# Patient Record
Sex: Female | Born: 1945 | Race: White | Hispanic: No | Marital: Married | Smoking: Never smoker
Health system: Southern US, Community
[De-identification: ages and names within clinical notes are randomized; demographics above are authoritative.]

## PROBLEM LIST (undated history)

## (undated) DIAGNOSIS — E119 Type 2 diabetes mellitus without complications: Secondary | ICD-10-CM

## (undated) DIAGNOSIS — G629 Polyneuropathy, unspecified: Secondary | ICD-10-CM

## (undated) DIAGNOSIS — I10 Essential (primary) hypertension: Secondary | ICD-10-CM

## (undated) DIAGNOSIS — M109 Gout, unspecified: Secondary | ICD-10-CM

## (undated) HISTORY — PX: ABDOMINAL HYSTERECTOMY: SHX81

---

## 2011-09-15 DIAGNOSIS — I10 Essential (primary) hypertension: Secondary | ICD-10-CM | POA: Insufficient documentation

## 2011-09-15 DIAGNOSIS — E669 Obesity, unspecified: Secondary | ICD-10-CM | POA: Insufficient documentation

## 2011-09-15 DIAGNOSIS — G629 Polyneuropathy, unspecified: Secondary | ICD-10-CM | POA: Insufficient documentation

## 2012-03-05 ENCOUNTER — Other Ambulatory Visit: Payer: Self-pay | Admitting: Obstetrics and Gynecology

## 2012-03-05 DIAGNOSIS — R921 Mammographic calcification found on diagnostic imaging of breast: Secondary | ICD-10-CM

## 2012-03-13 ENCOUNTER — Other Ambulatory Visit: Payer: Self-pay

## 2012-03-22 ENCOUNTER — Ambulatory Visit
Admission: RE | Admit: 2012-03-22 | Discharge: 2012-03-22 | Disposition: A | Discharge status: Home / Self Care | Payer: Medicare Other | Source: Ambulatory Visit | Attending: Obstetrics and Gynecology | Admitting: Obstetrics and Gynecology

## 2012-03-22 DIAGNOSIS — R921 Mammographic calcification found on diagnostic imaging of breast: Secondary | ICD-10-CM

## 2012-08-31 DIAGNOSIS — G250 Essential tremor: Secondary | ICD-10-CM | POA: Insufficient documentation

## 2013-08-01 ENCOUNTER — Ambulatory Visit: Payer: Self-pay | Admitting: Family Medicine

## 2013-08-07 DIAGNOSIS — E785 Hyperlipidemia, unspecified: Secondary | ICD-10-CM | POA: Insufficient documentation

## 2013-08-07 DIAGNOSIS — F419 Anxiety disorder, unspecified: Secondary | ICD-10-CM | POA: Insufficient documentation

## 2013-12-09 DIAGNOSIS — K219 Gastro-esophageal reflux disease without esophagitis: Secondary | ICD-10-CM | POA: Insufficient documentation

## 2014-04-19 ENCOUNTER — Ambulatory Visit: Payer: Self-pay | Admitting: Internal Medicine

## 2014-04-19 ENCOUNTER — Inpatient Hospital Stay: Payer: Self-pay | Admitting: Internal Medicine

## 2014-06-15 NOTE — H&P (Signed)
PATIENT NAMESHLEY, [name]:  [id number] DATE OF BIRTH:  1946/01/17  DATE OF ADMISSION:  04/19/2014  REFERRING PHYSICIAN: Dr. Daryel November in the Emergency Room.   FAMILY PHYSICIAN: Dr. Marina Morrow in [place].     REASON FOR ADMISSION: Non-DKA, hyperglycemia.   HISTORY OF PRESENT ILLNESS: The patient is a 69 year old female with a significant history of type 1 diabetes as well as hypertension and hyperlipidemia and depression.  She presented to the Emergency Room today with lethargy, somnolence, and slurred speech. Falling asleep, easily. The patient was found to be profoundly hypoglycemic with hyponatremia. She was in acute renal failure with dehydration. Her anion gap was normal. She was started on the insulin drip and is now admitted for further evaluation.   PAST MEDICAL HISTORY:  1.  Type 1 diabetes.  2.  Benign hypertension.  3.  Hyperlipidemia.  4.  Diabetic neuropathy.  5.  Depression.  6.  Status post hysterectomy.   MEDICATIONS:  1.  Pravastatin 10 mg p.o. at bedtime.  2.  Metformin 1000 mg p.o. b.i.d.  3.  Zestoretic 20/25 1 p.o. daily.  4.  Gabapentin 300 mg 3 capsules at bedtime.  5.  Cymbalta 60 mg p.o. daily.  6.  Claritin 10 mg p.o. daily.   ALLERGIES: LEVAQUIN, TRAMADOL, AND BACTRIM.   SOCIAL HISTORY: The patient is a former smoker, but none recently. Denies alcohol abuse.   FAMILY HISTORY: Positive for diabetes, hypertension, coronary artery disease, and stroke.   REVIEW OF SYSTEMS:    CONSTITUTIONAL: No fever or change in weight.  EYES: No blurred or double vision. No glaucoma.  ENT: No tinnitus or hearing loss. No nasal discharge or bleeding. No difficulty swallowing.  RESPIRATORY: No cough or wheezing. Denies hemoptysis.  CARDIOVASCULAR: No chest pain or orthopnea. No syncope.  GASTROINTESTINAL: The patient has had nausea, vomiting, but no diarrhea or abdominal pain. No change in bowel habits.  GENITOURINARY: No dysuria or hematuria. No  incontinence.  ENDOCRINE: No polyuria or polydipsia. No heat or cold intolerance.  HEMATOLOGIC: The patient denies anemia, easy bruising, or bleeding.  LYMPHATIC: No swollen glands.  MUSCULOSKELETAL: The patient has pain in her neck, back, shoulders, knees, or hips. No gout.  NEUROLOGIC: The patient has had numbness, but denies migraines, stroke or seizures.  PSYCHOLOGICAL: The patient denies anxiety, insomnia, or depression.   PHYSICAL EXAMINATION:  GENERAL: The patient is acutely ill-appearing, slightly lethargic.  VITAL SIGNS: Currently remarkable for a blood pressure of 109/48, heart rate 104, respiratory rate of 19, temperature of 97.9, and saturation 97% on room air.  HEENT: Normocephalic, atraumatic. Pupils are equal, round, and reactive to light and accommodation. Extraocular movements are intact. Sclerae are not icteric. Conjunctivae are clear.  OROPHARYNX: Dry, but clear.  NECK: Supple without JVD. No adenopathy or thyromegaly is noted.  LUNGS: Clear to auscultation and percussion without wheezes, rales, or rhonchi. No dullness. Respiratory effort is normal.  CARDIAC EXAMINATION: Rapid rate with a regular rhythm. Normal S1 and S2. No significant rubs or gallops. PMI is nondisplaced. Chest wall is nontender.  ABDOMEN: Soft, nontender, with normoactive bowel sounds. No organomegaly or masses were appreciated. No hernias or bruits were noted.  EXTREMITIES: Without clubbing, cyanosis, edema. Pulses were 2+ bilaterally.  SKIN: Warm and dry without rash or lesions.  NEUROLOGIC EXAMINATION: Cranial nerves II through XII grossly intact. Deep tendon reflexes were symmetric. Motor and sensory exam is nonfocal.  PSYCHIATRIC EXAMINATION: A patient who was alert and oriented to person,  place, and time. She was cooperative and used good judgment.   LABORATORY DATA: White count was 7.3 with a hemoglobin of 14.2. Glucose was 706 with a BUN of 49, creatinine of 2.21, and a GFR of 23. Sodium was 125.  Urinalysis was unremarkable. Chest x-ray was negative.   ASSESSMENT:  1.  Non-diabetic ketoacidosis hyperglycemia. 2.  Acute renal failure.  3.  Dehydration.  4.  Hyponatremia.  5.  Tachycardia.   PLAN: The patient will be admitted to the Intensive Care Unit with IV fluids and insulin drip. We will follow her sugars hourly and adjust the drip as needed. We will use Zofran as needed for nausea or vomiting and a clear liquid diet for now. We will begin empiric IV antibiotics. Follow up routine labs in the morning. Further treatment and evaluation will depend upon the patient's progress.   TOTAL TIME SPENT ON THIS PATIENT:  50 minutes.     ____________________________ Duane LopeJeffrey D. Judithann SheenSparks, MD jds:at D: 04/19/2014 19:37:13 ET T: 04/19/2014 20:18:50 ET JOB#: [id number]  cc: Duane LopeJeffrey D. Judithann SheenSparks, MD, <Dictator> Chelsea Goodellale E. Feldpausch, MD JEFFREY Rodena Medin [name] MD ELECTRONICALLY SIGNED 04/20/2014 13:26

## 2014-06-15 NOTE — Discharge Summary (Signed)
PATIENT NAMERAKHI, [name]:  [id number] DATE OF BIRTH:  [birth year]  DATE OF ADMISSION:  04/19/2014 DATE OF DISCHARGE:  04/22/2014  DISCHARGE DIAGNOSES: 1. Non-diabetic ketoacidosis hyperglycemia.  3. Noncompliance.  CONSULTATION: Diabetic clinic.   PROCEDURES: None.   BRIEF HISTORY AND PHYSICAL AND HOSPITAL COURSE BASED ON THE PROBLEM: The patient is a 69 year old female who came into the ED with a chief complaint of lethargy, slurred speech and some malaise. The patient was falling asleep very easily. She was found to be hypoglycemic and hyponatremic. Please see history and physical for details. The patient's anion gap was normal. She was started on IV fluids and an insulin drip in the ED.   BRIEF HISTORY COURSE BASED ON THE PROBLEM: 1. Non-diabetic ketoacidosis hyperglycemia. Admitted to intensive care unit. The patient was provided with IV fluids and insulin GTT. Blood sugars were monitored on an hourly basis and the insulin drip was titrated, with IV fluids and insulin drip the patient's blood sugar significantly improved. Her clinical situation significantly improved. Lethargy was resolved. By the next day, we were able to wean her off the insulin drip. She was started on a clear liquid diet and advanced to ADA diet. The patient has reported that she was not taking her medications on a regular basis as she was skipping her meals. The patient has reported that she is under a lot of stress. Her mother is actively dying in the next few days. The patient's hemoglobin A1c was elevated at 13.6. She is started on Levemir insulin to take twice a day. Initially we started at 10 units subcutaneously, which was increased to 10 units subcutaneously b.i.d. and some titration was made based on her blood sugars. The patient was evaluated by diabetic nurse from [facility]. They have provided her insulin teaching and diabetic education was provided. Reinforced the importance of being compliant with  the patient's insulin and a regular diet schedule. The patient is started on NovoLog 5 units subcutaneously t.i.d. with meals and Levemir subsequently changed to 20 units subcutaneously 2 times a day. The patient's her blood sugars were not completely well controlled, and she has requested to discharge her home as she was clinically feeling okay.   The patient has reported that her mother is actively dying, and she wants to spend with her mom in her last few days, and she does not want to  feel guilty. As per patient's request, the decision is made to discharge the patient home. We have reinforced the importance of taking insulin on a regular basis after checking Accu-Cheks before meals and at bedtime. Also suggested not to skip meals. The patient is to follow up with endocrinology, Dr. Tedd Sias, in 2 weeks and outpatient diabetic clinic in 1 week.  2. Acute renal failure is resolved with IV fluids.  3. Dehydration is resolved. 4. Hyponatremia, which is pseudohyponatremia from hyperglycemia is resolved.  5. The tachycardia is resolved with IV fluids. Deconditioning was provided with physical therapy on March 8.   PHYSICAL EXAMINATION:  VITAL SIGNS: Temperature 98, pulse was at 78, respirations 18, blood pressure 146/81, pulse oximetry 95% on room air.  GENERAL APPEARANCE: Not in acute distress. Moderately built and nourished. HEENT: Normocephalic, atraumatic. Pupils are equally reacting to light and accommodation. No scleral icterus. No conjunctival injection. No sinus tenderness. Moist mucous membranes.  NECK: Supple. No JVD. No thyromegaly. Range of motion is intact.  LUNGS: Clear to auscultation bilaterally. No accessory muscle use. CARDIAC: S1, S2 normal. Regular  rate and rhythm.  GASTROINTESTINAL: Soft. Bowel sounds are positive in all 4 quadrants. Nontender, nondistended. No masses felt.  NEUROLOGIC: Awake, alert, oriented x3. Cranial nerves II through XII are grossly intact. Motor and sensory are  intact. Reflexes are 2+.  EXTREMITIES: No edema. No cyanosis. No clubbing.  SKIN: Warm to touch. Normal turgor. No rashes. No lesions.  PSYCHIATRIC: Normal mood and affect.  LABORATORY AND IMAGING STUDIES: Hemoglobin A1c at 13.6. Urinalysis: Yellow in color. Nitrites and leukocyte esterase are negative. CBC is normal. BMP: On March 7, BUN 22, creatinine 1.03. Sodium, potassium and chloride are normal. GFR 57. Serum osmolality normal.   MEDICATIONS AT THE TIME OF DISCHARGE: Cymbalta 60 mg 1 capsule p.o. once daily, Claritin 10 mg p.o. once daily, pravastatin 10 mg p.o. once daily at bedtime, Advil 200 mg 1 tablet p.o. once daily as needed, gabapentin 300 mg 1 capsule 3 times a day by mouth, hydrochlorothiazide/lisinopril 25/20 mg, 1 tablet p.o. once daily, losartan 50 mg 1 tablet p.o. once daily, insulin aspart 5 units subcutaneously 3 times a day with meals, Levemir insulin 25 units subcutaneously 2 times a day. Discontinued her home medication metformin.   DIET: Low-sodium, low-fat, carbohydrate control at regular consistency.   ACTIVITY: As tolerated.  Follow up with primary care physician in 1 week, outpatient diabetic clinic in 1 to 2 weeks. Also recommended to follow up with outpatient endocrinology in 2 weeks. Prescriptions were also given for a Glucometer, insulin pen, test strips, and needles.  The diagnosis and plan of care were discussed in detail with the patient. She verbalized understanding of the plan.  TOTAL TIME SPENT ON DISCHARGE AND COORDINATION OF CARE: 45 minutes.    ____________________________ Chelsea LabAruna Yadhira Mckneely, MD ag:jh D: 04/24/2014 22:03:08 ET T: 04/25/2014 09:05:50 ET JOB#: [id number]  cc: Chelsea LabAruna Jancarlo Biermann, MD, <Dictator> Marina Goodellale E. Feldpausch, MD Chelsea LabARUNA Serenity Fortner MD ELECTRONICALLY SIGNED 05/05/2014 23:03

## 2014-06-23 DIAGNOSIS — E1142 Type 2 diabetes mellitus with diabetic polyneuropathy: Secondary | ICD-10-CM | POA: Insufficient documentation

## 2015-06-05 ENCOUNTER — Other Ambulatory Visit: Payer: Self-pay | Admitting: Obstetrics and Gynecology

## 2015-06-05 DIAGNOSIS — R928 Other abnormal and inconclusive findings on diagnostic imaging of breast: Secondary | ICD-10-CM

## 2015-06-11 ENCOUNTER — Ambulatory Visit
Admission: RE | Admit: 2015-06-11 | Discharge: 2015-06-11 | Disposition: A | Discharge status: Home / Self Care | Payer: Medicare Other | Source: Ambulatory Visit | Attending: Obstetrics and Gynecology | Admitting: Obstetrics and Gynecology

## 2015-06-11 DIAGNOSIS — R928 Other abnormal and inconclusive findings on diagnostic imaging of breast: Secondary | ICD-10-CM

## 2016-12-30 ENCOUNTER — Encounter: Payer: Self-pay | Admitting: Emergency Medicine

## 2016-12-30 ENCOUNTER — Emergency Department
Admission: EM | Admit: 2016-12-30 | Discharge: 2016-12-30 | Disposition: A | Discharge status: Home / Self Care | Payer: Medicare Other | Attending: Emergency Medicine | Admitting: Emergency Medicine

## 2016-12-30 ENCOUNTER — Other Ambulatory Visit: Payer: Self-pay

## 2016-12-30 DIAGNOSIS — M79672 Pain in left foot: Secondary | ICD-10-CM | POA: Diagnosis present

## 2016-12-30 DIAGNOSIS — I10 Essential (primary) hypertension: Secondary | ICD-10-CM | POA: Diagnosis not present

## 2016-12-30 DIAGNOSIS — E119 Type 2 diabetes mellitus without complications: Secondary | ICD-10-CM | POA: Insufficient documentation

## 2016-12-30 DIAGNOSIS — M10072 Idiopathic gout, left ankle and foot: Secondary | ICD-10-CM | POA: Insufficient documentation

## 2016-12-30 HISTORY — DX: Type 2 diabetes mellitus without complications: E11.9

## 2016-12-30 HISTORY — DX: Gout, unspecified: M10.9

## 2016-12-30 HISTORY — DX: Essential (primary) hypertension: I10

## 2016-12-30 HISTORY — DX: Polyneuropathy, unspecified: G62.9

## 2016-12-30 MED ORDER — COLCHICINE 0.6 MG PO TABS
0.6000 mg | ORAL_TABLET | Freq: Once | ORAL | Status: AC
  Administered 2016-12-30: 0.6 mg via ORAL
  Filled 2016-12-30: qty 1

## 2016-12-30 MED ORDER — TRAMADOL-ACETAMINOPHEN 37.5-325 MG PO TABS: 1.0000 | ORAL_TABLET | Freq: Four times a day (QID) | ORAL | 0 refills | Status: DC | PRN

## 2016-12-30 MED ORDER — TRAMADOL HCL 50 MG PO TABS
50.0000 mg | ORAL_TABLET | Freq: Once | ORAL | Status: AC
  Administered 2016-12-30: 50 mg via ORAL
  Filled 2016-12-30: qty 1

## 2016-12-30 MED ORDER — COLCHICINE 0.6 MG PO TABS: 0.6000 mg | ORAL_TABLET | Freq: Every day | ORAL | 0 refills | Status: DC

## 2016-12-30 MED ORDER — KETOROLAC TROMETHAMINE 30 MG/ML IJ SOLN
30.0000 mg | Freq: Once | INTRAMUSCULAR | Status: AC
  Administered 2016-12-30: 30 mg via INTRAMUSCULAR
  Filled 2016-12-30: qty 1

## 2016-12-30 NOTE — ED Provider Notes (Signed)
[facility] Emergency Department Provider Note   ____________________________________________   First MD Initiated Contact with Patient 12/30/16 [phone]     (approximate)  I have reviewed the triage vital signs and the nursing notes.   HISTORY  Chief Complaint Foot Pain    HPI Chelsea Morrow is a 71 y.o. female patient complains of foot pain for 1 day. Patient states she believes a filling of her gout. Patient is: Podiatry was told she could not be seen until 01/03/2017. Patient rates the pain as a 10 over 10. Patient describes pain as "achy". No palliative measures for complaint.   Past Medical History:  Diagnosis Date  . Diabetes mellitus without complication (HCC)   . Gout   . Hypertension   . Neuropathy     There are no active problems to display for this patient.   Past Surgical History:  Procedure Laterality Date  . ABDOMINAL HYSTERECTOMY    . CESAREAN SECTION      Prior to Admission medications   Medication Sig Start Date End Date Taking? Authorizing Provider  colchicine 0.6 MG tablet Take 1 tablet (0.6 mg total) daily by mouth. 12/30/16 12/30/17  Joni ReiningSmith, Analyce Tavares K, PA-C  traMADol-acetaminophen (ULTRACET) 37.5-325 MG tablet Take 1 tablet every 6 (six) hours as needed by mouth. 12/30/16   Joni ReiningSmith, Shuntay Everetts K, PA-C    Allergies Bactrim [sulfamethoxazole-trimethoprim]  No family history on file.  Social History Social History   Tobacco Use  . Smoking status: Never Smoker  . Smokeless tobacco: Never Used  Substance Use Topics  . Alcohol use: Not on file  . Drug use: Not on file    Review of Systems Constitutional: No fever/chills Eyes: No visual changes. ENT: No sore throat. Cardiovascular: Denies chest pain. Respiratory: Denies shortness of breath. Gastrointestinal: No abdominal pain.  No nausea, no vomiting.  No diarrhea.  No constipation. Genitourinary: Negative for dysuria. Musculoskeletal: Left dorsal foot pain  Skin:  Negative for rash. Neurological: Negative for headaches, focal weakness or numbness. Endocrine:Diabetes, gout, and hypertension. Allergic/Immunilogical: Bactrim ____________________________________________   PHYSICAL EXAM:  VITAL SIGNS: ED Triage Vitals [12/30/16 0827]  Enc Vitals Group     BP (!) 127/57     Pulse Rate 91     Resp 16     Temp (!) 97.5 F (36.4 C)     Temp Source Oral     SpO2 96 %     Weight 235 lb (106.6 kg)     Height 5\' 8"  (1.727 m)     Head Circumference      Peak Flow      Pain Score 10     Pain Loc      Pain Edu?      Excl. in GC?     Constitutional: Alert and oriented. Well appearing and in no acute distress. Cardiovascular: Normal rate, regular rhythm. Grossly normal heart sounds.  Good peripheral circulation. Respiratory: Normal respiratory effort.  No retractions. Lungs CTAB. Gastrointestinal: Soft and nontender. No distention. No abdominal bruits. No CVA tenderness. Musculoskeletal: No obvious deformity to the left foot. Moderate guarding palpation dose aspect of the foot. Neurologic:  Normal speech and language. No gross focal neurologic deficits are appreciated. No gait instability. Skin:  Skin is warm, dry and intact. No rash noted. Edema and erythema dose aspect left foot. Psychiatric: Mood and affect are normal. Speech and behavior are normal.  ____________________________________________   LABS (all labs ordered are listed, but only abnormal results are displayed)  Labs Reviewed - No data to display ____________________________________________  EKG   ____________________________________________  RADIOLOGY  No results found.  ____________________________________________   PROCEDURES  Procedure(s) performed: None  Procedures  Critical Care performed: No  ____________________________________________   INITIAL IMPRESSION / ASSESSMENT AND PLAN / ED COURSE  As part of my medical decision making, I reviewed the following  data within the electronic MEDICAL RECORD NUMBER    Left pain secondary to flareup of gout. Advised patient to keep her pending podiatry appointment next week. Patient given discharge Instructions. Patient given prescription for colchicine and Ultracet.      ____________________________________________   FINAL CLINICAL IMPRESSION(S) / ED DIAGNOSES  Final diagnoses:  Acute idiopathic gout of left foot     ED Discharge Orders        Ordered    traMADol-acetaminophen (ULTRACET) 37.5-325 MG tablet  Every 6 hours PRN     12/30/16 0850    colchicine 0.6 MG tablet  Daily     12/30/16 0850       Note:  This document was prepared using Dragon voice recognition software and may include unintentional dictation errors.    Joni ReiningSmith, Nishi Neiswonger K, PA-C 12/30/16 [id number]    Nita SickleVeronese, [place], MD 01/04/17 [phone]

## 2016-12-30 NOTE — ED Notes (Signed)
Pt reports gout pain to the left great toe, pt states she tried to get in with PCP and podiatry but unable to get in until Tuesday. Pt states she couldn't wait that long. Pt states the pain is worse when standing.

## 2016-12-30 NOTE — ED Triage Notes (Signed)
C/O left foot pain x 1 day.  Patient states this is a gout flare up.

## 2017-04-29 ENCOUNTER — Ambulatory Visit: Payer: Medicare Other

## 2017-04-29 ENCOUNTER — Other Ambulatory Visit: Payer: Self-pay

## 2017-04-29 ENCOUNTER — Ambulatory Visit
Admission: EM | Admit: 2017-04-29 | Discharge: 2017-04-29 | Disposition: A | Discharge status: Home / Self Care | Payer: Medicare Other | Attending: Family Medicine | Admitting: Family Medicine

## 2017-04-29 DIAGNOSIS — R509 Fever, unspecified: Secondary | ICD-10-CM | POA: Diagnosis present

## 2017-04-29 DIAGNOSIS — E860 Dehydration: Secondary | ICD-10-CM

## 2017-04-29 DIAGNOSIS — J40 Bronchitis, not specified as acute or chronic: Secondary | ICD-10-CM

## 2017-04-29 DIAGNOSIS — I700 Atherosclerosis of aorta: Secondary | ICD-10-CM | POA: Diagnosis not present

## 2017-04-29 DIAGNOSIS — E119 Type 2 diabetes mellitus without complications: Secondary | ICD-10-CM | POA: Insufficient documentation

## 2017-04-29 DIAGNOSIS — E876 Hypokalemia: Secondary | ICD-10-CM

## 2017-04-29 DIAGNOSIS — R05 Cough: Secondary | ICD-10-CM | POA: Diagnosis not present

## 2017-04-29 DIAGNOSIS — R0602 Shortness of breath: Secondary | ICD-10-CM | POA: Insufficient documentation

## 2017-04-29 LAB — CBC WITH DIFFERENTIAL/PLATELET
BASOS PCT: 0 %
Basophils Absolute: 0 10*3/uL (ref 0–0.1)
EOS ABS: 0.1 10*3/uL (ref 0–0.7)
Eosinophils Relative: 2 %
HCT: 36.6 % (ref 35.0–47.0)
Hemoglobin: 12.3 g/dL (ref 12.0–16.0)
Lymphocytes Relative: 7 %
Lymphs Abs: 0.5 10*3/uL — ABNORMAL LOW (ref 1.0–3.6)
MCH: 29.7 pg (ref 26.0–34.0)
MCHC: 33.5 g/dL (ref 32.0–36.0)
MCV: 88.7 fL (ref 80.0–100.0)
MONO ABS: 0.6 10*3/uL (ref 0.2–0.9)
MONOS PCT: 8 %
Neutro Abs: 6.5 10*3/uL (ref 1.4–6.5)
Neutrophils Relative %: 83 %
Platelets: 219 10*3/uL (ref 150–440)
RBC: 4.13 MIL/uL (ref 3.80–5.20)
RDW: 16 % — AB (ref 11.5–14.5)
WBC: 7.8 10*3/uL (ref 3.6–11.0)

## 2017-04-29 LAB — COMPREHENSIVE METABOLIC PANEL
ALT: 28 U/L (ref 14–54)
ANION GAP: 11 (ref 5–15)
AST: 32 U/L (ref 15–41)
Albumin: 4.2 g/dL (ref 3.5–5.0)
Alkaline Phosphatase: 65 U/L (ref 38–126)
BILIRUBIN TOTAL: 0.5 mg/dL (ref 0.3–1.2)
BUN: 24 mg/dL — ABNORMAL HIGH (ref 6–20)
CO2: 20 mmol/L — ABNORMAL LOW (ref 22–32)
Calcium: 9 mg/dL (ref 8.9–10.3)
Chloride: 105 mmol/L (ref 101–111)
Creatinine, Ser: 1.08 mg/dL — ABNORMAL HIGH (ref 0.44–1.00)
GFR, EST AFRICAN AMERICAN: 58 mL/min — AB (ref >60)
GFR, EST NON AFRICAN AMERICAN: 50 mL/min — AB (ref >60)
Glucose, Bld: 133 mg/dL — ABNORMAL HIGH (ref 65–99)
POTASSIUM: 3.3 mmol/L — AB (ref 3.5–5.1)
Sodium: 136 mmol/L (ref 135–145)
TOTAL PROTEIN: 7.8 g/dL (ref 6.5–8.1)

## 2017-04-29 LAB — RAPID INFLUENZA A&B ANTIGENS: Influenza B (ARMC): NEGATIVE

## 2017-04-29 LAB — RAPID INFLUENZA A&B ANTIGENS (ARMC ONLY): INFLUENZA A (ARMC): NEGATIVE

## 2017-04-29 LAB — GLUCOSE, CAPILLARY: GLUCOSE-CAPILLARY: 117 mg/dL — AB (ref 65–99)

## 2017-04-29 MED ORDER — DOXYCYCLINE HYCLATE 100 MG PO CAPS: 100.0000 mg | ORAL_CAPSULE | Freq: Two times a day (BID) | ORAL | 0 refills | Status: DC

## 2017-04-29 MED ORDER — POTASSIUM CHLORIDE CRYS ER 20 MEQ PO TBCR: 20.0000 meq | EXTENDED_RELEASE_TABLET | Freq: Two times a day (BID) | ORAL | 0 refills | Status: DC

## 2017-04-29 MED ORDER — PREDNISONE 50 MG PO TABS: ORAL_TABLET | ORAL | 0 refills | Status: DC

## 2017-04-29 NOTE — ED Provider Notes (Signed)
[facility]  CSN: [id number] Arrival date & time: 04/29/17  1344  History   Chief Complaint Chief Complaint  Patient presents with  . Cough   HPI   72 year old female presents with cough.  History difficult to obtain from the patient.  She is very sluggish and slow to respond.  She reports a 2-day history of cough.  Productive.  Associated fever.  Associated shortness of breath.  No known exacerbating or relieving factors.  No medications or interventions tried.  Cough is interfering with sleep.  No other reported symptoms.  No other complaints or concerns at this time.  Past Medical History:  Diagnosis Date  . Diabetes mellitus without complication (HCC)   . Gout   . Hypertension   . Neuropathy    Past Surgical History:  Procedure Laterality Date  . ABDOMINAL HYSTERECTOMY    . CESAREAN SECTION     OB History    No data available     Home Medications    loratadine (CLARITIN) 10 mg tablet  Take 10 mg by mouth nightly.  0   Active  ibuprofen (ADVIL,MOTRIN) 200 MG tablet  Take 400 mg by mouth every 8 (eight) hours as needed for Pain.   0   Active  blood glucose diagnostic (ACCU-CHEK AVIVA PLUS TEST STRP) test strip  USE ONE STRIP TO CHECK GLUCOSE 4 TIMES DAILY. DX E11.65 300 each  5 03/18/2016  Active  gabapentin (NEURONTIN) 300 MG capsule  Indications: Chronic painful diabetic neuropathy , unspecified (CMS-HCC) Take 3 capsules twice daily. 540 capsule  3 05/17/2016 05/17/2017 Active  lisinopril-hydrochlorothiazide (PRINZIDE,ZESTORETIC) 20-25 mg tablet  TAKE ONE TABLET BY MOUTH ONCE DAILY 90 tablet  3 06/06/2016  Active  DULoxetine (CYMBALTA) 60 MG DR capsule  Indications: Chronic painful diabetic neuropathy , unspecified (CMS-HCC) TAKE ONE CAPSULE BY MOUTH ONCE DAILY 90 capsule  3 07/14/2016  Active  metFORMIN (GLUCOPHAGE XR) 500 MG XR tablet  Take 2 tablets (1,000 mg total) by mouth 2 (two) times daily. 360 tablet  3 08/03/2016 08/03/2017  Active  cyanocobalamin (VITAMIN B12) 1000 MCG tablet  Take 1,000 mcg by mouth every morning.  0   Active  mv-mn/folic acid/calcium/vit K (ONE-A-DAY WOMEN'S 50 PLUS ORAL)  Take by mouth.  0   Active  colchicine (COLCRYS) 0.6 mg tablet  Take 2 tablets (1.2mg ) by mouth at first sign of gout flare followed by 1 tablet (0.6mg ) after 1 hour. (Max 1.8mg  within 1 hour) 6 tablet  2 02/02/2017  Active  pravastatin (PRAVACHOL) 10 MG tablet  TAKE ONE TABLET BY MOUTH AT NIGHT TIME 90 tablet  1 02/10/2017  Active  topiramate (TOPAMAX) 50 MG tablet  Take 3 tabs at night for 1 week then increase to 4 tabs at night. 120 tablet  6 04/24/2017  Active  topiramate (TOPAMAX) 25 MG tablet  Take 1 tab nightly for 1 week, then 2 tabs nightly for 1 week, then 3 tabs nightly for 1 week, then 4 tabs nightly.        Family History Heart disease Father    High blood pressure (Hypertension) Father    Myocardial Infarction (Heart attack) Father    Sleep apnea Father    Alzheimer's disease Mother    Breast cancer Mother    High blood pressure (Hypertension) Mother    Hypothyroidism Mother    Macular degeneration Mother    Myocardial Infarction (Heart attack) Mother    Osteoarthritis Mother    Sleep apnea Mother  High blood pressure (Hypertension) Sister    Hyperlipidemia (Elevated cholesterol) Sister    Hypothyroidism Sister    High blood pressure (Hypertension) Sister    Hyperlipidemia (Elevated cholesterol) Sister    Hypothyroidism Sister      Social History Social History   Tobacco Use  . Smoking status: Never Smoker  . Smokeless tobacco: Never Used  Substance Use Topics  . Alcohol use: Yes    Comment: occasional  . Drug use: No   Allergies   Bactrim [sulfamethoxazole-trimethoprim]   Review of Systems Review of Systems  Constitutional: Positive for fever.  Respiratory: Positive for cough and shortness of breath.    Physical Exam Triage Vital  Signs ED Triage Vitals  Enc Vitals Group     BP 04/29/17 1404 (!) 154/68     Pulse Rate 04/29/17 1404 (!) 120     Resp 04/29/17 1404 20     Temp 04/29/17 1404 100 F (37.8 C)     Temp Source 04/29/17 1404 Oral     SpO2 04/29/17 1404 95 %     Weight 04/29/17 1401 255 lb (115.7 kg)     Height 04/29/17 1401 5\' 8"  (1.727 m)     Head Circumference --      Peak Flow --      Pain Score --      Pain Loc --      Pain Edu? --      Excl. in GC? --    Updated Vital Signs BP (!) 154/68 (BP Location: Left Arm)   Pulse (!) 120   Temp 100 F (37.8 C) (Oral)   Resp 20   Ht 5\' 8"  (1.727 m)   Wt 255 lb (115.7 kg)   SpO2 95%   BMI 38.77 kg/m    Physical Exam  Constitutional: She appears well-developed.  Lethargic. Slow to respond.  HENT:  Head: Normocephalic and atraumatic.  Nose: Nose normal.  Eyes: Conjunctivae are normal. Right eye exhibits no discharge. Left eye exhibits no discharge.  Cardiovascular:  Tachycardic. Regular rhythm.  Pulmonary/Chest: Effort normal and breath sounds normal. She has no wheezes. She has no rales.  Neurological: She is alert.  Very slow to respond. Lethargic.  Psychiatric:  Flat affect.  Nursing note and vitals reviewed.  UC Treatments / Results  Labs (all labs ordered are listed, but only abnormal results are displayed) Labs Reviewed  CBC WITH DIFFERENTIAL/PLATELET - Abnormal; Notable for the following components:      Result Value   RDW 16.0 (*)    Lymphs Abs 0.5 (*)    All other components within normal limits  COMPREHENSIVE METABOLIC PANEL - Abnormal; Notable for the following components:   Potassium 3.3 (*)    CO2 20 (*)    Glucose, Bld 133 (*)    BUN 24 (*)    Creatinine, Ser 1.08 (*)    GFR calc non Af Amer 50 (*)    GFR calc Af Amer 58 (*)    All other components within normal limits  GLUCOSE, CAPILLARY - Abnormal; Notable for the following components:   Glucose-Capillary 117 (*)    All other components within normal limits    RAPID INFLUENZA A&B ANTIGENS ([facility] ONLY)  CBG MONITORING, ED    EKG  EKG Interpretation None       Radiology Dg Chest 2 View  Result Date: 04/29/2017 CLINICAL DATA:  Cough and fever.  Shortness of breath. EXAM: CHEST - 2 VIEW COMPARISON:  April 19, 2014 FINDINGS:  There is no edema or consolidation. Heart size and pulmonary vascularity are normal. No adenopathy. There is aortic atherosclerosis. There is mild degenerative change in the thoracic spine. IMPRESSION: Aortic atherosclerosis.  No edema or consolidation. Aortic Atherosclerosis (ICD10-I70.0). Electronically Signed   By: Bretta Bang III M.D.   On: 04/29/2017 15:52    Procedures Procedures (including critical care time)  Medications Ordered in UC Medications - No data to display   Initial Impression / Assessment and Plan / UC Course  I have reviewed the triage vital signs and the nursing notes.  Pertinent labs & imaging results that were available during my care of the patient were reviewed by me and considered in my medical decision making (see chart for details).    72 year old female presents with findings consistent with bronchitis.  Flu negative.  Chest x-ray was negative.  I was very concerned about her clinical appearance.  I discussed this with her husband.  Laboratory studies were obtained to ensure no underlying hyperglycemia or other Edavally that would be contributing to her lethargy.  Labs revealed mild dehydration and hypokalemia.  Treating as below.  Final Clinical Impressions(s) / UC Diagnoses   Final diagnoses:  Bronchitis  Dehydration  Hypokalemia    ED Discharge Orders        Ordered    predniSONE (DELTASONE) 50 MG tablet     04/29/17 1559    doxycycline (VIBRAMYCIN) 100 MG capsule  2 times daily     04/29/17 1559    potassium chloride SA (K-DUR,KLOR-CON) 20 MEQ tablet  2 times daily     04/29/17 1640     Controlled Substance Prescriptions [place] Controlled Substance Registry consulted?  Not Applicable   Tommie Sams, DO 04/29/17 1734

## 2017-04-29 NOTE — ED Triage Notes (Addendum)
Pt with 2 days of productive cough of yellow phlegm. Pt with low grade fever. "I've got bronchitis"

## 2017-06-19 DIAGNOSIS — M1A00X0 Idiopathic chronic gout, unspecified site, without tophus (tophi): Secondary | ICD-10-CM | POA: Insufficient documentation

## 2018-05-28 ENCOUNTER — Other Ambulatory Visit (HOSPITAL_COMMUNITY): Payer: Self-pay | Admitting: Nephrology

## 2018-05-28 ENCOUNTER — Other Ambulatory Visit: Payer: Self-pay | Admitting: Nephrology

## 2018-05-28 DIAGNOSIS — N183 Chronic kidney disease, stage 3 unspecified: Secondary | ICD-10-CM

## 2018-06-01 ENCOUNTER — Ambulatory Visit
Admission: RE | Admit: 2018-06-01 | Discharge: 2018-06-01 | Disposition: A | Discharge status: Home / Self Care | Payer: Medicare Other | Source: Ambulatory Visit | Attending: Nephrology | Admitting: Nephrology

## 2018-06-01 ENCOUNTER — Other Ambulatory Visit: Payer: Self-pay

## 2018-06-01 DIAGNOSIS — N183 Chronic kidney disease, stage 3 unspecified: Secondary | ICD-10-CM

## 2018-11-02 DIAGNOSIS — N183 Chronic kidney disease, stage 3 unspecified: Secondary | ICD-10-CM | POA: Insufficient documentation

## 2018-11-02 DIAGNOSIS — N200 Calculus of kidney: Secondary | ICD-10-CM | POA: Insufficient documentation

## 2018-11-02 DIAGNOSIS — E1122 Type 2 diabetes mellitus with diabetic chronic kidney disease: Secondary | ICD-10-CM | POA: Insufficient documentation

## 2019-03-19 ENCOUNTER — Emergency Department
Admission: EM | Admit: 2019-03-19 | Discharge: 2019-03-19 | Disposition: A | Discharge status: Home / Self Care | Payer: Medicare Other | Attending: Emergency Medicine | Admitting: Emergency Medicine

## 2019-03-19 ENCOUNTER — Other Ambulatory Visit: Payer: Self-pay

## 2019-03-19 DIAGNOSIS — E1165 Type 2 diabetes mellitus with hyperglycemia: Secondary | ICD-10-CM | POA: Insufficient documentation

## 2019-03-19 DIAGNOSIS — I10 Essential (primary) hypertension: Secondary | ICD-10-CM | POA: Diagnosis not present

## 2019-03-19 DIAGNOSIS — N289 Disorder of kidney and ureter, unspecified: Secondary | ICD-10-CM | POA: Diagnosis not present

## 2019-03-19 DIAGNOSIS — E860 Dehydration: Secondary | ICD-10-CM | POA: Diagnosis not present

## 2019-03-19 DIAGNOSIS — Z794 Long term (current) use of insulin: Secondary | ICD-10-CM | POA: Diagnosis not present

## 2019-03-19 DIAGNOSIS — R739 Hyperglycemia, unspecified: Secondary | ICD-10-CM

## 2019-03-19 LAB — CBC WITH DIFFERENTIAL/PLATELET
Abs Immature Granulocytes: 0.05 10*3/uL (ref 0.00–0.07)
Basophils Absolute: 0.1 10*3/uL (ref 0.0–0.1)
Basophils Relative: 1 %
Eosinophils Absolute: 0.2 10*3/uL (ref 0.0–0.5)
Eosinophils Relative: 3 %
HCT: 38.5 % (ref 36.0–46.0)
Hemoglobin: 12.2 g/dL (ref 12.0–15.0)
Immature Granulocytes: 1 %
Lymphocytes Relative: 24 %
Lymphs Abs: 1.9 10*3/uL (ref 0.7–4.0)
MCH: 29.3 pg (ref 26.0–34.0)
MCHC: 31.7 g/dL (ref 30.0–36.0)
MCV: 92.5 fL (ref 80.0–100.0)
Monocytes Absolute: 0.6 10*3/uL (ref 0.1–1.0)
Monocytes Relative: 7 %
Neutro Abs: 5 10*3/uL (ref 1.7–7.7)
Neutrophils Relative %: 64 %
Platelets: 204 10*3/uL (ref 150–400)
RBC: 4.16 MIL/uL (ref 3.87–5.11)
RDW: 14.5 % (ref 11.5–15.5)
WBC: 7.7 10*3/uL (ref 4.0–10.5)
nRBC: 0 % (ref 0.0–0.2)

## 2019-03-19 LAB — BLOOD GAS, VENOUS
Acid-base deficit: 2.2 mmol/L — ABNORMAL HIGH (ref 0.0–2.0)
Bicarbonate: 23.2 mmol/L (ref 20.0–28.0)
O2 Saturation: 82.5 %
Patient temperature: 37
pCO2, Ven: 41 mmHg — ABNORMAL LOW (ref 44.0–60.0)
pH, Ven: 7.36 (ref 7.250–7.430)
pO2, Ven: 49 mmHg — ABNORMAL HIGH (ref 32.0–45.0)

## 2019-03-19 LAB — COMPREHENSIVE METABOLIC PANEL
ALT: 21 U/L (ref 0–44)
AST: 19 U/L (ref 15–41)
Albumin: 4.1 g/dL (ref 3.5–5.0)
Alkaline Phosphatase: 116 U/L (ref 38–126)
Anion gap: 14 (ref 5–15)
BUN: 54 mg/dL — ABNORMAL HIGH (ref 8–23)
CO2: 23 mmol/L (ref 22–32)
Calcium: 9.8 mg/dL (ref 8.9–10.3)
Chloride: 92 mmol/L — ABNORMAL LOW (ref 98–111)
Creatinine, Ser: 1.93 mg/dL — ABNORMAL HIGH (ref 0.44–1.00)
GFR calc Af Amer: 29 mL/min — ABNORMAL LOW (ref >60)
GFR calc non Af Amer: 25 mL/min — ABNORMAL LOW (ref >60)
Glucose, Bld: 566 mg/dL (ref 70–99)
Potassium: 4 mmol/L (ref 3.5–5.1)
Sodium: 129 mmol/L — ABNORMAL LOW (ref 135–145)
Total Bilirubin: 0.6 mg/dL (ref 0.3–1.2)
Total Protein: 7.4 g/dL (ref 6.5–8.1)

## 2019-03-19 LAB — URINALYSIS, COMPLETE (UACMP) WITH MICROSCOPIC
Bacteria, UA: NONE SEEN
Bilirubin Urine: NEGATIVE
Glucose, UA: >=500 mg/dL — AB
Ketones, ur: NEGATIVE mg/dL
Nitrite: NEGATIVE
Protein, ur: NEGATIVE mg/dL
Specific Gravity, Urine: 1.017 (ref 1.005–1.030)
pH: 5 (ref 5.0–8.0)

## 2019-03-19 LAB — GLUCOSE, CAPILLARY
Glucose-Capillary: >600 mg/dL (ref 70–99)
Glucose-Capillary: 392 mg/dL — ABNORMAL HIGH (ref 70–99)
Glucose-Capillary: 311 mg/dL — ABNORMAL HIGH (ref 70–99)

## 2019-03-19 LAB — BETA-HYDROXYBUTYRIC ACID: Beta-Hydroxybutyric Acid: 0.67 mmol/L — ABNORMAL HIGH (ref 0.05–0.27)

## 2019-03-19 MED ORDER — GLIPIZIDE 5 MG PO TABS
5.0000 mg | ORAL_TABLET | Freq: Every day | ORAL | Status: DC
  Administered 2019-03-19: 5 mg via ORAL
  Filled 2019-03-19: qty 1

## 2019-03-19 MED ORDER — SODIUM CHLORIDE 0.9 % IV BOLUS
500.0000 mL | Freq: Once | INTRAVENOUS | Status: AC
  Administered 2019-03-19: 12:00:00 500 mL via INTRAVENOUS

## 2019-03-19 MED ORDER — GLIPIZIDE 5 MG PO TABS: 5.0000 mg | ORAL_TABLET | Freq: Every day | ORAL | Status: DC

## 2019-03-19 MED ORDER — GLIPIZIDE 5 MG PO TABS: 5.0000 mg | ORAL_TABLET | Freq: Two times a day (BID) | ORAL | 11 refills | Status: DC

## 2019-03-19 MED ORDER — INSULIN ASPART 100 UNIT/ML ~~LOC~~ SOLN
5.0000 [IU] | Freq: Once | SUBCUTANEOUS | Status: AC
  Administered 2019-03-19: 5 [IU] via SUBCUTANEOUS
  Filled 2019-03-19: qty 1

## 2019-03-19 MED ORDER — INSULIN ASPART 100 UNIT/ML ~~LOC~~ SOLN
10.0000 [IU] | Freq: Once | SUBCUTANEOUS | Status: AC
  Administered 2019-03-19: 10 [IU] via SUBCUTANEOUS
  Filled 2019-03-19: qty 1

## 2019-03-19 MED ORDER — SODIUM CHLORIDE 0.9 % IV SOLN
1000.0000 mL | Freq: Once | INTRAVENOUS | Status: AC
  Administered 2019-03-19: 1000 mL via INTRAVENOUS

## 2019-03-19 NOTE — ED Notes (Signed)
Pt assisted to bathroom

## 2019-03-19 NOTE — ED Triage Notes (Signed)
Pt states she was feeling shaking and weak with increased thirst and urination and states she took her glucose and it read High. States she has a hx of DM but was taken off her meds years ago.

## 2019-03-19 NOTE — ED Provider Notes (Signed)
[facility] Emergency Department Provider Note       Time seen: ----------------------------------------- 8:57 AM on 03/19/2019 -----------------------------------------   I have reviewed the triage vital signs and the nursing notes.  HISTORY   Chief Complaint Hyperglycemia    HPI Chelsea Morrow is a 74 y.o. female with a history of diabetes, gout, hypertension, neuropathy who presents to the ED for feeling shaky and weak with increased thirst and urination for several days.  Patient states she took her glucose and it read high.  Patient states in the past she was prediabetic but has not had medication in over a year.  She denies fevers, chills, chest pain, shortness of breath, vomiting or diarrhea.  Past Medical History:  Diagnosis Date  . Diabetes mellitus without complication (HCC)   . Gout   . Hypertension   . Neuropathy     There are no problems to display for this patient.   Past Surgical History:  Procedure Laterality Date  . ABDOMINAL HYSTERECTOMY    . CESAREAN SECTION      Allergies Bactrim [sulfamethoxazole-trimethoprim], Levofloxacin, Fenofibrate, and Tramadol  Social History Social History   Tobacco Use  . Smoking status: Never Smoker  . Smokeless tobacco: Never Used  Substance Use Topics  . Alcohol use: Yes    Comment: occasional  . Drug use: No    Review of Systems Constitutional: Negative for fever. Cardiovascular: Negative for chest pain. Respiratory: Negative for shortness of breath. Gastrointestinal: Negative for abdominal pain, vomiting and diarrhea. Genitourinary: Positive for polyuria Musculoskeletal: Negative for back pain. Skin: Negative for rash. Neurological: Positive for weakness  All systems negative/normal/unremarkable except as stated in the HPI  ____________________________________________   PHYSICAL EXAM:  VITAL SIGNS: ED Triage Vitals  Enc Vitals Group     BP 03/19/19 0846 (!) 114/56      Pulse Rate 03/19/19 0846 (!) 113     Resp 03/19/19 0846 18     Temp 03/19/19 0848 97.9 F (36.6 C)     Temp Source 03/19/19 0846 Oral     SpO2 03/19/19 0846 96 %     Weight 03/19/19 0846 227 lb (103 kg)     Height 03/19/19 0846 5\' 8"  (1.727 m)     Head Circumference --      Peak Flow --      Pain Score 03/19/19 0846 0     Pain Loc --      Pain Edu? --      Excl. in GC? --     Constitutional: Alert and oriented.  Mild distress Eyes: Conjunctivae are normal. Normal extraocular movements. ENT      Head: Normocephalic and atraumatic.      Nose: No congestion/rhinnorhea.      Mouth/Throat: Mucous membranes are moist.      Neck: No stridor. Cardiovascular: Rapid rate, regular rhythm. No murmurs, rubs, or gallops. Respiratory: Tachypnea with clear breath sounds Gastrointestinal: Soft and nontender. Normal bowel sounds Musculoskeletal: Nontender with normal range of motion in extremities. No lower extremity tenderness nor edema. Neurologic:  Normal speech and language. No gross focal neurologic deficits are appreciated.  Skin:  Skin is warm, dry and intact. No rash noted. Psychiatric: Mood and affect are normal. Speech and behavior are normal.  ____________________________________________  EKG: Interpreted by me.  Sinus tachycardia with a rate of 102 bpm, normal PR interval, normal QRS, normal QT  ____________________________________________  ED COURSE:  As part of my medical decision making, I reviewed the following  data within the [facility] History obtained from family if available, nursing notes, old chart and ekg, as well as notes from prior ED visits. Patient presented for weakness and symptoms of hyperglycemia, we will assess with labs and imaging as indicated at this time.   Procedures  Chelsea Morrow was evaluated in Emergency Department on 03/19/2019 for the symptoms described in the history of present illness. She was evaluated in the context of the  global COVID-19 pandemic, which necessitated consideration that the patient might be at risk for infection with the SARS-CoV-2 virus that causes COVID-19. Institutional protocols and algorithms that pertain to the evaluation of patients at risk for COVID-19 are in a state of rapid change based on information released by regulatory bodies including the CDC and federal and state organizations. These policies and algorithms were followed during the patient's care in the ED.  ____________________________________________   LABS (pertinent positives/negatives)  Labs Reviewed  GLUCOSE, CAPILLARY - Abnormal; Notable for the following components:      Result Value   Glucose-Capillary >600 (*)    All other components within normal limits  COMPREHENSIVE METABOLIC PANEL - Abnormal; Notable for the following components:   Sodium 129 (*)    Chloride 92 (*)    Glucose, Bld 566 (*)    BUN 54 (*)    Creatinine, Ser 1.93 (*)    GFR calc non Af Amer 25 (*)    GFR calc Af Amer 29 (*)    All other components within normal limits  URINALYSIS, COMPLETE (UACMP) WITH MICROSCOPIC - Abnormal; Notable for the following components:   Color, Urine STRAW (*)    APPearance CLEAR (*)    Glucose, UA >=500 (*)    Hgb urine dipstick MODERATE (*)    Leukocytes,Ua TRACE (*)    All other components within normal limits  BLOOD GAS, VENOUS - Abnormal; Notable for the following components:   pCO2, Ven 41 (*)    pO2, Ven 49.0 (*)    Acid-base deficit 2.2 (*)    All other components within normal limits  BETA-HYDROXYBUTYRIC ACID - Abnormal; Notable for the following components:   Beta-Hydroxybutyric Acid 0.67 (*)    All other components within normal limits  GLUCOSE, CAPILLARY - Abnormal; Notable for the following components:   Glucose-Capillary 392 (*)    All other components within normal limits  CBC WITH DIFFERENTIAL/PLATELET  GLUCOSE, CAPILLARY  CBG MONITORING, ED  CBG MONITORING, ED   ____________________________________________ CRITICAL CARE Performed by: Laurence Aly   Total critical care time: 30 minutes  Critical care time was exclusive of separately billable procedures and treating other patients.  Critical care was necessary to treat or prevent imminent or life-threatening deterioration.  Critical care was time spent personally by me on the following activities: development of treatment plan with patient and/or surrogate as well as nursing, discussions with consultants, evaluation of patient's response to treatment, examination of patient, obtaining history from patient or surrogate, ordering and performing treatments and interventions, ordering and review of laboratory studies, ordering and review of radiographic studies, pulse oximetry and re-evaluation of patient's condition.    DIFFERENTIAL DIAGNOSIS   Dehydration, electrolyte abnormality, DKA, HH NK, renal failure  FINAL ASSESSMENT AND PLAN  Hyperglycemia, AKI from dehydration   Plan: The patient had presented for symptoms of hyperglycemia and weakness. Patient's labs initially revealed severe hyperglycemia with a blood sugar over 600.  After fluids and several doses of insulin blood sugar has improved significantly.  Clinically  I do not think she is in DKA, beta hydroxybutyric acid was slightly elevated she has a normal anion gap.  She has started on glipizide after discussing with her nephrologist.  She will have close outpatient follow-up   Ulice Dash, MD    Note: This note was generated in part or whole with voice recognition software. Voice recognition is usually quite accurate but there are transcription errors that can and very often do occur. I apologize for any typographical errors that were not detected and corrected.     Emily Filbert, MD 03/19/19 [phone]

## 2019-05-09 DIAGNOSIS — E1140 Type 2 diabetes mellitus with diabetic neuropathy, unspecified: Secondary | ICD-10-CM | POA: Insufficient documentation

## 2019-06-27 ENCOUNTER — Other Ambulatory Visit: Payer: Self-pay | Admitting: Orthopedic Surgery

## 2019-06-27 DIAGNOSIS — M545 Low back pain, unspecified: Secondary | ICD-10-CM

## 2019-06-27 DIAGNOSIS — M48062 Spinal stenosis, lumbar region with neurogenic claudication: Secondary | ICD-10-CM

## 2019-06-27 DIAGNOSIS — M4807 Spinal stenosis, lumbosacral region: Secondary | ICD-10-CM

## 2019-07-10 ENCOUNTER — Other Ambulatory Visit: Payer: Self-pay

## 2019-07-10 ENCOUNTER — Ambulatory Visit
Admission: RE | Admit: 2019-07-10 | Discharge: 2019-07-10 | Disposition: A | Discharge status: Home / Self Care | Payer: Medicare Other | Source: Ambulatory Visit | Attending: Orthopedic Surgery | Admitting: Orthopedic Surgery

## 2019-07-10 DIAGNOSIS — M4807 Spinal stenosis, lumbosacral region: Secondary | ICD-10-CM | POA: Diagnosis present

## 2019-07-10 DIAGNOSIS — M545 Low back pain, unspecified: Secondary | ICD-10-CM

## 2019-07-10 DIAGNOSIS — M48062 Spinal stenosis, lumbar region with neurogenic claudication: Secondary | ICD-10-CM | POA: Insufficient documentation

## 2019-11-11 DIAGNOSIS — N289 Disorder of kidney and ureter, unspecified: Secondary | ICD-10-CM | POA: Insufficient documentation

## 2020-01-27 ENCOUNTER — Ambulatory Visit (INDEPENDENT_AMBULATORY_CARE_PROVIDER_SITE_OTHER): Payer: Medicare Other | Admitting: Internal Medicine

## 2020-01-27 VITALS — BP 135/79 | HR 72 | Temp 97.3°F | Resp 16 | Ht 67.0 in | Wt 232.0 lb

## 2020-01-27 DIAGNOSIS — G4733 Obstructive sleep apnea (adult) (pediatric): Secondary | ICD-10-CM

## 2020-01-27 DIAGNOSIS — Z9989 Dependence on other enabling machines and devices: Secondary | ICD-10-CM | POA: Diagnosis not present

## 2020-01-27 DIAGNOSIS — Z7189 Other specified counseling: Secondary | ICD-10-CM | POA: Diagnosis not present

## 2020-01-27 NOTE — Progress Notes (Signed)
[facility] [address] [place], [place] [address]  Pulmonary Sleep Medicine   Office Visit Note  Patient Name: Chelsea Morrow DOB: [birth year] MRN [id number]    Chief Complaint: Obstructive Sleep Apnea visit  Brief History:  Chelsea Morrow is seen today for follow up The patient has a 13 year history of sleep apnea. Patient is using PAP nightly.  She is taking diphenhydramine for sleep. With this she can sleep 11-16hours. Then other nights she can't sleep. The patient feels more rested after sleeping with PAP and can't sleep without it.  The patient reports benefiting from PAP use. Reported sleepiness is  improved and the Epworth Sleepiness Score is 9 out of 24. The patient occasionally take naps. The patient complains of the following: no complaints.  The compliance download shows excellent compliance with an average use time of 8.9 hours. The AHI is 0.9  The patient does not complain of limb movements disrupting sleep but she does have neuropathy.  ROS  General: (-) fever, (-) chills, (-) night sweat Nose and Sinuses: (-) nasal stuffiness or itchiness, (-) postnasal drip, (-) nosebleeds, (-) sinus trouble. Mouth and Throat: (-) sore throat, (-) hoarseness. Neck: (-) swollen glands, (-) enlarged thyroid, (-) neck pain. Respiratory: - cough, - shortness of breath, - wheezing. Neurologic: - numbness, - tingling. Psychiatric: - anxiety, -- depression   Current Medication: Outpatient Encounter Medications as of 01/27/2020  Medication Sig  . allopurinol (ZYLOPRIM) 300 MG tablet Take 300 mg by mouth daily.  . DULoxetine (CYMBALTA) 60 MG capsule Take 60 mg by mouth daily.  Marland Kitchen gabapentin (NEURONTIN) 300 MG capsule Take 900 mg by mouth 2 (two) times daily.  Marland Kitchen glipiZIDE (GLUCOTROL) 5 MG tablet Take 1 tablet (5 mg total) by mouth 2 (two) times daily.  Marland Kitchen lisinopril-hydrochlorothiazide (ZESTORETIC) 20-25 MG tablet Take 1 tablet by mouth daily.  . metFORMIN (GLUCOPHAGE) 500 MG tablet  Take 500 mg by mouth 2 (two) times daily.  . Multiple Vitamin (MULTIVITAMIN WITH MINERALS) TABS tablet Take 1 tablet by mouth daily.  . pravastatin (PRAVACHOL) 20 MG tablet Take 20 mg by mouth daily.  Marland Kitchen topiramate (TOPAMAX) 50 MG tablet Take 200 mg by mouth at bedtime.  . vitamin B-12 (CYANOCOBALAMIN) 1000 MCG tablet Take 1,000 mcg by mouth daily.   No facility-administered encounter medications on file as of 01/27/2020.    Surgical History: Past Surgical History:  Procedure Laterality Date  . ABDOMINAL HYSTERECTOMY    . CESAREAN SECTION      Medical History: Past Medical History:  Diagnosis Date  . Diabetes mellitus without complication (HCC)   . Gout   . Hypertension   . Neuropathy     Family History: Non contributory to the present illness  Social History: Social History   Socioeconomic History  . Marital status: Married    Spouse name: Not on file  . Number of children: Not on file  . Years of education: Not on file  . Highest education level: Not on file  Occupational History  . Not on file  Tobacco Use  . Smoking status: Never Smoker  . Smokeless tobacco: Never Used  Substance and Sexual Activity  . Alcohol use: Yes    Comment: occasional  . Drug use: No  . Sexual activity: Not on file  Other Topics Concern  . Not on file  Social History Narrative  . Not on file   Social Determinants of Health   Financial Resource Strain: Not on file  Food Insecurity:  Not on file  Transportation Needs: Not on file  Physical Activity: Not on file  Stress: Not on file  Social Connections: Not on file  Intimate Partner Violence: Not on file    Vital Signs: Blood pressure 135/79, pulse 72, temperature (!) 97.3 F (36.3 C), temperature source Temporal, resp. rate 16, height 5\' 7"  (1.702 m), weight 232 lb (105.2 kg), SpO2 96 %.  Examination: General Appearance: The patient is well-developed, well-nourished, and in no distress. Neck Circumference: 39 Skin: Gross  inspection of skin unremarkable. Head: normocephalic, no gross deformities. Eyes: no gross deformities noted. ENT: ears appear grossly normal Neurologic: Alert and oriented. No involuntary movements.    EPWORTH SLEEPINESS SCALE:  Scale:  (0)= no chance of dozing; (1)= slight chance of dozing; (2)= moderate chance of dozing; (3)= high chance of dozing  Chance  Situtation    Sitting and reading: 1    Watching TV: 1    Sitting Inactive in public: 0    As a passenger in car: 2      Lying down to rest: 3    Sitting and talking: 2    Sitting quielty after lunch: 0    In a car, stopped in traffic: 0   TOTAL SCORE:   9 out of 24    SLEEP STUDIES:  1. PSG 04/18/06,  AHI 19.8,  Low SpO2 88%   CPAP COMPLIANCE DATA:  Date Range: 01/25/20 - 01/24/19  Average Daily Use: 8:50 hours  Median Use: 9:05 hours  Compliance for > 4 Hours: 91%  AHI: 0.9 respiratory events per hour  Days Used: 364/365   Mask Leak: 31.8 lpm  95th Percentile Pressure: 14.0 cmH2)         LABS: No results found for this or any previous visit (from the past 2160 hour(s)).  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 EXAM: MRI LUMBAR SPINE WITHOUT CONTRAST TECHNIQUE: Multiplanar, multisequence MR imaging of the lumbar spine was performed. No intravenous contrast was administered. COMPARISON:  None. FINDINGS: Segmentation:  Standard. Alignment:  Minimal anterolisthesis of L3 over L4. Vertebrae:  No fracture, evidence of discitis, or bone lesion. Conus medullaris and cauda equina: Conus extends to the L1 level. Conus and cauda equina appear normal. Paraspinal and other soft tissues: Small left renal cysts. Disc levels: T12-L1: No spinal canal or neural foraminal stenosis. L1-2: Shallow disc bulge and mild facet degenerative changes without significant spinal canal or neural foraminal stenosis.  L2-3: Shallow disc bulge with central annular tear, mild facet degenerative changes and ligamentum flavum redundancy without significant spinal canal or neural foraminal stenosis. L3-4: Shallow disc bulge, hypertrophic facet degenerative changes, right greater than left and ligamentum flavum redundancy resulting in mild spinal canal stenosis. No significant neural foraminal narrowing. L4-5: Disc bulge with superimposed small central disc protrusion, hypertrophic facet degenerative change ligamentum flavum redundancy resulting in mild-to-moderate spinal canal stenosis with narrowing of the bilateral subarticular zones, left greater than right. No significant neural foraminal narrowing. L5-S1: Mild facet degenerative changes, left greater than right. No significant spinal canal or neural foraminal stenosis. IMPRESSION: 1. Multilevel degenerative changes of the lumbar spine, worst at L4-L5 where there is mild-to-moderate spinal canal stenosis with narrowing of the bilateral subarticular zones, left greater than right. 2. Mild spinal canal stenosis at L3-L4. 3. No significant neural foraminal narrowing at any level. Electronically Signed   By: 07/12/2019.D.  On: 07/10/2019 14:00    No results found.  No results found.    Assessment and Plan: Patient Active Problem List   Diagnosis Date Noted  . CPAP use counseling 01/27/2020  . OSA on CPAP 01/27/2020      The patient does tolerate PAP and reports significant benefit from PAP use. The patient was reminded how to clean the CPAP and advised to limit time in bed to no more than 8 hours until sleeping well nightly and to limit naps to no more than 20 minutes. The patient was also counselled on continuing to watch her diet and to get regular exercise. The compliance is excellent. The apnea is well controlled.   1. OSA-continue excellent compliance. Follow stimulus control recommendations. 2. CPAP couseling-Discussed importance of  adequate CPAP use as well as proper care and cleaning techniques of machine and all supplies.   General Counseling: I have discussed the findings of the evaluation and examination with Chelsea Morrow.  I have also discussed any further diagnostic evaluation thatmay be needed or ordered today. Chelsea Morrow verbalizes understanding of the findings of todays visit. We also reviewed her medications today and discussed drug interactions and side effects including but not limited excessive drowsiness and altered mental states. We also discussed that there is always a risk not just to her but also people around her. she has been encouraged to call the office with any questions or concerns that should arise related to todays visit.  No orders of the defined types were placed in this encounter.    This patient was seen by Layla Barter, AGNP-C in collaboration with Dr. Freda Munro as a part of collaborative care agreement.    I have personally obtained a history, examined the patient, evaluated laboratory and imaging results, formulated the assessment and plan and placed orders.   Valentino Hue Sol Blazing, PhD, FAASM  Diplomate, American Board of Sleep Medicine    Yevonne Pax, MD [facility] Diplomate ABMS Pulmonary and Critical Care Medicine Sleep medicine

## 2020-01-27 NOTE — Patient Instructions (Signed)

## 2020-02-20 DIAGNOSIS — F1190 Opioid use, unspecified, uncomplicated: Secondary | ICD-10-CM | POA: Insufficient documentation

## 2020-03-11 ENCOUNTER — Other Ambulatory Visit: Payer: Self-pay | Admitting: Emergency Medicine

## 2020-03-17 ENCOUNTER — Other Ambulatory Visit: Payer: Self-pay | Admitting: Emergency Medicine

## 2020-04-10 ENCOUNTER — Ambulatory Visit
Admission: EM | Admit: 2020-04-10 | Discharge: 2020-04-10 | Disposition: A | Discharge status: Home / Self Care | Payer: Medicare Other | Attending: Family Medicine | Admitting: Family Medicine

## 2020-04-10 ENCOUNTER — Ambulatory Visit (INDEPENDENT_AMBULATORY_CARE_PROVIDER_SITE_OTHER): Payer: Medicare Other

## 2020-04-10 ENCOUNTER — Other Ambulatory Visit: Payer: Self-pay

## 2020-04-10 DIAGNOSIS — Z20822 Contact with and (suspected) exposure to covid-19: Secondary | ICD-10-CM | POA: Diagnosis not present

## 2020-04-10 DIAGNOSIS — R0602 Shortness of breath: Secondary | ICD-10-CM | POA: Diagnosis not present

## 2020-04-10 DIAGNOSIS — Z885 Allergy status to narcotic agent status: Secondary | ICD-10-CM | POA: Insufficient documentation

## 2020-04-10 DIAGNOSIS — R062 Wheezing: Secondary | ICD-10-CM | POA: Insufficient documentation

## 2020-04-10 DIAGNOSIS — Z888 Allergy status to other drugs, medicaments and biological substances status: Secondary | ICD-10-CM | POA: Diagnosis not present

## 2020-04-10 DIAGNOSIS — Z79899 Other long term (current) drug therapy: Secondary | ICD-10-CM | POA: Insufficient documentation

## 2020-04-10 DIAGNOSIS — Z881 Allergy status to other antibiotic agents status: Secondary | ICD-10-CM | POA: Insufficient documentation

## 2020-04-10 DIAGNOSIS — Z7984 Long term (current) use of oral hypoglycemic drugs: Secondary | ICD-10-CM | POA: Insufficient documentation

## 2020-04-10 DIAGNOSIS — R059 Cough, unspecified: Secondary | ICD-10-CM

## 2020-04-10 DIAGNOSIS — J209 Acute bronchitis, unspecified: Secondary | ICD-10-CM | POA: Insufficient documentation

## 2020-04-10 MED ORDER — GUAIFENESIN-CODEINE 100-10 MG/5ML PO SYRP: 5.0000 mL | ORAL_SOLUTION | Freq: Three times a day (TID) | ORAL | 0 refills | Status: DC | PRN

## 2020-04-10 MED ORDER — AEROCHAMBER MV MISC: 2 refills | Status: DC

## 2020-04-10 MED ORDER — BENZONATATE 100 MG PO CAPS: 200.0000 mg | ORAL_CAPSULE | Freq: Three times a day (TID) | ORAL | 0 refills | Status: DC

## 2020-04-10 MED ORDER — ALBUTEROL SULFATE HFA 108 (90 BASE) MCG/ACT IN AERS: 2.0000 | INHALATION_SPRAY | RESPIRATORY_TRACT | 0 refills | Status: DC | PRN

## 2020-04-10 MED ORDER — DOXYCYCLINE HYCLATE 100 MG PO CAPS: 100.0000 mg | ORAL_CAPSULE | Freq: Two times a day (BID) | ORAL | 0 refills | Status: DC

## 2020-04-10 NOTE — Discharge Instructions (Addendum)
Use the Tessalon Perles every 8 hours during the day as needed for cough.  Take these with a small sip of water.  They may give you some numbness to the base of your tongue or a metallic taste in your mouth, this is normal.  Use the Robitussin with codeine cough syrup every 8 hours as needed for cough and congestion.  Use your albuterol inhaler with the spacer, 2 puffs every 4-6 hours, as needed for shortness of breath and wheezing.  Take the doxycycline twice daily for 10 days with food.  Return for reevaluation, or see your primary care provider, for any new or worsening symptoms.

## 2020-04-10 NOTE — ED Provider Notes (Signed)
[facility]    CSN: [id number] Arrival date & time: 04/10/20  1532      History   Chief Complaint Chief Complaint  Patient presents with  . Cough    HPI Chelsea Morrow is a 75 y.o. female.   HPI   75 year old female here for evaluation of cough.  Patient reports that her symptoms started yesterday. They began with a runny nose and then her cough developed. Her cough is intermittently productive for yellow sputum. Patient also is complaining of shortness of breath and wheezing. Patient denies fever, ear pressure, sore throat, or GI symptoms.  Past Medical History:  Diagnosis Date  . Diabetes mellitus without complication (HCC)   . Gout   . Hypertension   . Neuropathy     Patient Active Problem List   Diagnosis Date Noted  . CPAP use counseling 01/27/2020  . OSA on CPAP 01/27/2020    Past Surgical History:  Procedure Laterality Date  . ABDOMINAL HYSTERECTOMY    . CESAREAN SECTION      OB History   No obstetric history on file.      Home Medications    Prior to Admission medications   Medication Sig Start Date End Date Taking? Authorizing Provider  albuterol (VENTOLIN HFA) 108 (90 Base) MCG/ACT inhaler Inhale 2 puffs into the lungs every 4 (four) hours as needed. 04/10/20  Yes Becky Augusta, NP  allopurinol (ZYLOPRIM) 300 MG tablet Take 300 mg by mouth daily.   Yes [provider]  benzonatate (TESSALON) 100 MG capsule Take 2 capsules (200 mg total) by mouth every 8 (eight) hours. 04/10/20  Yes Becky Augusta, NP  doxycycline (VIBRAMYCIN) 100 MG capsule Take 1 capsule (100 mg total) by mouth 2 (two) times daily. 04/10/20  Yes Becky Augusta, NP  DULoxetine (CYMBALTA) 60 MG capsule Take 60 mg by mouth daily.   Yes [provider]  gabapentin (NEURONTIN) 300 MG capsule Take 900 mg by mouth 2 (two) times daily.   Yes [provider]  glipiZIDE (GLUCOTROL) 5 MG tablet Take 1 tablet (5 mg total) by mouth 2 (two) times daily. 03/19/19  03/18/20 Yes Emily Filbert, MD  guaiFENesin-codeine [facility]) 100-10 MG/5ML syrup Take 5 mLs by mouth 3 (three) times daily as needed for cough. 04/10/20  Yes Becky Augusta, NP  lisinopril-hydrochlorothiazide (ZESTORETIC) 20-25 MG tablet Take 1 tablet by mouth daily.   Yes [provider]  metFORMIN (GLUCOPHAGE) 500 MG tablet Take 500 mg by mouth 2 (two) times daily. 11/05/19  Yes [provider]  Multiple Vitamin (MULTIVITAMIN WITH MINERALS) TABS tablet Take 1 tablet by mouth daily.   Yes [provider]  pravastatin (PRAVACHOL) 20 MG tablet Take 20 mg by mouth daily.   Yes [provider]  Spacer/Aero-Holding Chambers (AEROCHAMBER MV) inhaler Use as instructed 04/10/20  Yes Becky Augusta, NP  topiramate (TOPAMAX) 50 MG tablet Take 200 mg by mouth at bedtime.   Yes [provider]  vitamin B-12 (CYANOCOBALAMIN) 1000 MCG tablet Take 1,000 mcg by mouth daily.   Yes [provider]    Family History History reviewed. No pertinent family history.  Social History Social History   Tobacco Use  . Smoking status: Never Smoker  . Smokeless tobacco: Never Used  Vaping Use  . Vaping Use: Never used  Substance Use Topics  . Alcohol use: Yes    Comment: occasional  . Drug use: No     Allergies   Bactrim [sulfamethoxazole-trimethoprim], Levofloxacin, Fenofibrate,  and Tramadol   Review of Systems Review of Systems  Constitutional: Positive for fatigue. Negative for activity change, appetite change and fever.  HENT: Positive for rhinorrhea. Negative for congestion and ear pain.   Respiratory: Positive for cough, shortness of breath and wheezing.   Gastrointestinal: Negative for diarrhea, nausea and vomiting.  Skin: Negative for rash.  Hematological: Negative.   Psychiatric/Behavioral: Negative.      Physical Exam Triage Vital Signs ED Triage Vitals  Enc Vitals Group     BP 04/10/20 1550 128/68     Pulse Rate 04/10/20 1550  (!) 115     Resp 04/10/20 1550 18     Temp 04/10/20 1550 99.4 F (37.4 C)     Temp Source 04/10/20 1550 Oral     SpO2 04/10/20 1550 96 %     Weight 04/10/20 1547 233 lb (105.7 kg)     Height 04/10/20 1547 5\' 7"  (1.702 m)     Head Circumference --      Peak Flow --      Pain Score 04/10/20 1547 0     Pain Loc --      Pain Edu? --      Excl. in GC? --    No data found.  Updated Vital Signs BP 128/68 (BP Location: Left Arm)   Pulse (!) 115   Temp 99.4 F (37.4 C) (Oral)   Resp 18   Ht 5\' 7"  (1.702 m)   Wt 233 lb (105.7 kg)   SpO2 96%   BMI 36.49 kg/m   Visual Acuity Right Eye Distance:   Left Eye Distance:   Bilateral Distance:    Right Eye Near:   Left Eye Near:    Bilateral Near:     Physical Exam Vitals and nursing note reviewed.  Constitutional:      General: She is not in acute distress.    Appearance: Normal appearance. She is not ill-appearing.  HENT:     Head: Normocephalic and atraumatic.     Right Ear: Tympanic membrane, ear canal and external ear normal.     Left Ear: Tympanic membrane, ear canal and external ear normal.     Nose: Nose normal. No congestion or rhinorrhea.     Mouth/Throat:     Mouth: Mucous membranes are moist.     Pharynx: Oropharynx is clear. No posterior oropharyngeal erythema.  Cardiovascular:     Rate and Rhythm: Normal rate and regular rhythm.     Pulses: Normal pulses.     Heart sounds: Normal heart sounds. No murmur heard. No gallop.   Pulmonary:     Effort: Pulmonary effort is normal.     Breath sounds: Wheezing and rhonchi present. No rales.  Skin:    General: Skin is warm and dry.     Capillary Refill: Capillary refill takes less than 2 seconds.     Findings: No erythema or rash.  Neurological:     General: No focal deficit present.     Mental Status: She is alert and oriented to person, place, and time.  Psychiatric:        Mood and Affect: Mood normal.        Behavior: Behavior normal.        Thought Content:  Thought content normal.        Judgment: Judgment normal.      UC Treatments / Results  Labs (all labs ordered are listed, but only abnormal results are displayed) Labs Reviewed  SARS  CORONAVIRUS 2 (TAT 6-24 HRS)    EKG   Radiology DG Chest 2 View  Result Date: 04/10/2020 CLINICAL DATA:  Cough short of breath and wheezing EXAM: CHEST - 2 VIEW COMPARISON:  04/29/2017 FINDINGS: The heart size and mediastinal contours are within normal limits. Both lungs are clear. Degenerative changes. IMPRESSION: No active cardiopulmonary disease. Electronically Signed   By: Jasmine PangKim  Fujinaga M.D.   On: 04/10/2020 16:43    Procedures Procedures (including critical care time)  Medications Ordered in UC Medications - No data to display  Initial Impression / Assessment and Plan / UC Course  I have reviewed the triage vital signs and the nursing notes.  Pertinent labs & imaging results that were available during my care of the patient were reviewed by me and considered in my medical decision making (see chart for details).   Patient is a very pleasant 75 year old female here for evaluation of productive cough, shortness of breath, and wheezing that started yesterday. Patient states that she has a longstanding history of bronchitis and she is afraid that is what she has now. Patient has a very harsh, wet cough. Patient does report that she brings up yellow sputum on occasion. Patient is afebrile. Physical exam reveals a benign upper respiratory tree. Lung sounds have wheezes and rhonchi in bilateral upper and middle lobes. No rales auscultated throughout. Will obtain chest x-ray and swab patient for COVID.  Chest x-ray independently evaluated by me.  Interpretation: Pulmonary vasculature is very prominent.  There is no focal consolidation or infiltrate noted.  Awaiting radiology overread. Radiology interpretation is that there is no acute intrathoracic process.  Will discharge patient home with a  diagnosis of bronchitis, will give Tessalon Perles to help with cough, albuterol inhaler with spacer for shortness of breath and wheezing, and CHeratussin cough syrup for cough and congestion, and doxycycline.   Final Clinical Impressions(s) / UC Diagnoses   Final diagnoses:  Acute bronchitis, unspecified organism     Discharge Instructions     Use the Tessalon Perles every 8 hours during the day as needed for cough.  Take these with a small sip of water.  They may give you some numbness to the base of your tongue or a metallic taste in your mouth, this is normal.  Use the Robitussin with codeine cough syrup every 8 hours as needed for cough and congestion.  Use your albuterol inhaler with the spacer, 2 puffs every 4-6 hours, as needed for shortness of breath and wheezing.  Take the doxycycline twice daily for 10 days with food.  Return for reevaluation, or see your primary care provider, for any new or worsening symptoms.    ED Prescriptions    Medication Sig Dispense Auth. Provider   doxycycline (VIBRAMYCIN) 100 MG capsule Take 1 capsule (100 mg total) by mouth 2 (two) times daily. 20 capsule Becky Augustayan, Venicia Vandall, NP   benzonatate (TESSALON) 100 MG capsule Take 2 capsules (200 mg total) by mouth every 8 (eight) hours. 21 capsule Becky Augustayan, Briyana Badman, NP   albuterol (VENTOLIN HFA) 108 (90 Base) MCG/ACT inhaler Inhale 2 puffs into the lungs every 4 (four) hours as needed. 18 g Becky Augustayan, Britian Jentz, NP   Spacer/Aero-Holding Chambers (AEROCHAMBER MV) inhaler Use as instructed 1 each Becky Augustayan, Kelly Eisler, NP   guaiFENesin-codeine (ROBITUSSIN AC) 100-10 MG/5ML syrup Take 5 mLs by mouth 3 (three) times daily as needed for cough. 120 mL Becky Augustayan, Asif Muchow, NP     I have reviewed the PDMP during this encounter.   Alycia Rossettiyan,  Riki Rusk, NP 04/10/20 [phone]

## 2020-04-10 NOTE — ED Triage Notes (Signed)
Pt c/o productive cough since yesterday or the day before,along with chest congestion and shob. Pt thinks she may have bronchitis. Pt has taken Robitussin last night with some improvement. Pt denies f/n/v/d or other symptoms.

## 2020-04-11 LAB — SARS CORONAVIRUS 2 (TAT 6-24 HRS): SARS Coronavirus 2: NEGATIVE

## 2021-03-29 ENCOUNTER — Ambulatory Visit (INDEPENDENT_AMBULATORY_CARE_PROVIDER_SITE_OTHER): Payer: Medicare Other | Admitting: Internal Medicine

## 2021-03-29 VITALS — BP 115/66 | HR 107 | Resp 16 | Ht 67.0 in | Wt 230.0 lb

## 2021-03-29 DIAGNOSIS — Z7189 Other specified counseling: Secondary | ICD-10-CM | POA: Diagnosis not present

## 2021-03-29 DIAGNOSIS — G4733 Obstructive sleep apnea (adult) (pediatric): Secondary | ICD-10-CM | POA: Diagnosis not present

## 2021-03-29 DIAGNOSIS — Z9989 Dependence on other enabling machines and devices: Secondary | ICD-10-CM | POA: Diagnosis not present

## 2021-03-29 NOTE — Patient Instructions (Signed)

## 2021-03-29 NOTE — Progress Notes (Signed)
[facility] [place], [place] [address]  Pulmonary Sleep Medicine   Office Visit Note  Patient Name: Chelsea Morrow DOB: 1945/10/01 MRN [id number]    Chief Complaint: Obstructive Sleep Apnea visit  Brief History:  Chelsea Morrow is seen today for follow up. The patient has a 14 year history of sleep apnea. Patient is using PAP nightly.  The patient feels great and can't live without it after sleeping with PAP.  The patient reports definitely benefits from PAP use.  Epworth Sleepiness Score is 2 out of 24. The patient seldom takes naps. The patient complains of the following: varied and sporadic sleep habits.  Bedtime varies from 10 pm to midnight and sometimes not at all. Has taken sleep aids but they make her feel like a zombie.  The compliance download shows  compliance with an average use time of 8:28 hours@ 92%. The AHI is 1.0  The patient does not complain of limb movements disrupting sleep.  ROS  General: (-) fever, (-) chills, (-) night sweat Nose and Sinuses: (-) nasal stuffiness or itchiness, (-) postnasal drip, (-) nosebleeds, (-) sinus trouble. Mouth and Throat: (-) sore throat, (-) hoarseness. Neck: (-) swollen glands, (-) enlarged thyroid, (-) neck pain. Respiratory: - cough, - shortness of breath, - wheezing. Neurologic: - numbness, - tingling. Psychiatric: - anxiety, + depression   Current Medication: Outpatient Encounter Medications as of 03/29/2021  Medication Sig   gabapentin (NEURONTIN) 300 MG capsule Take 3 capsules by mouth 2 (two) times daily.   glipiZIDE (GLUCOTROL) 5 MG tablet glipizide 5 mg tablet   metFORMIN (GLUCOPHAGE) 500 MG tablet Take by mouth.   pravastatin (PRAVACHOL) 20 MG tablet Take 1 tablet by mouth at bedtime.   albuterol (VENTOLIN HFA) 108 (90 Base) MCG/ACT inhaler Inhale 2 puffs into the lungs every 4 (four) hours as needed.   allopurinol (ZYLOPRIM) 300 MG tablet Take 300 mg by mouth daily.   benzonatate (TESSALON) 100 MG  capsule Take 2 capsules (200 mg total) by mouth every 8 (eight) hours.   DULoxetine (CYMBALTA) 60 MG capsule Take 60 mg by mouth daily.   glipiZIDE (GLUCOTROL) 5 MG tablet Take 1 tablet (5 mg total) by mouth 2 (two) times daily.   HYDROcodone-acetaminophen (NORCO/VICODIN) 5-325 MG tablet Take 1 tablet by mouth every 6 (six) hours as needed.   lisinopril (ZESTRIL) 20 MG tablet Take 20 mg by mouth daily.   lisinopril-hydrochlorothiazide (ZESTORETIC) 20-25 MG tablet Take 1 tablet by mouth daily.   metFORMIN (GLUCOPHAGE) 500 MG tablet Take 500 mg by mouth 2 (two) times daily.   Multiple Vitamin (MULTIVITAMIN WITH MINERALS) TABS tablet Take 1 tablet by mouth daily.   pravastatin (PRAVACHOL) 20 MG tablet Take 20 mg by mouth daily.   primidone (MYSOLINE) 50 MG tablet Take by mouth 2 (two) times daily.   Spacer/Aero-Holding Chambers (AEROCHAMBER MV) inhaler Use as instructed   topiramate (TOPAMAX) 100 MG tablet Take 200 mg by mouth at bedtime.   topiramate (TOPAMAX) 50 MG tablet Take 200 mg by mouth at bedtime.   vitamin B-12 (CYANOCOBALAMIN) 1000 MCG tablet Take 1,000 mcg by mouth daily.   [DISCONTINUED] doxycycline (VIBRAMYCIN) 100 MG capsule Take 1 capsule (100 mg total) by mouth 2 (two) times daily.   [DISCONTINUED] gabapentin (NEURONTIN) 300 MG capsule Take 900 mg by mouth 2 (two) times daily.   [DISCONTINUED] guaiFENesin-codeine (ROBITUSSIN AC) 100-10 MG/5ML syrup Take 5 mLs by mouth 3 (three) times daily as needed for cough.   No facility-administered  encounter medications on file as of 03/29/2021.    Surgical History: Past Surgical History:  Procedure Laterality Date   ABDOMINAL HYSTERECTOMY     CESAREAN SECTION      Medical History: Past Medical History:  Diagnosis Date   Diabetes mellitus without complication (HCC)    Gout    Hypertension    Neuropathy     Family History: Non contributory to the present illness  Social History: Social History   Socioeconomic History    Marital status: Married    Spouse name: Not on file   Number of children: Not on file   Years of education: Not on file   Highest education level: Not on file  Occupational History   Not on file  Tobacco Use   Smoking status: Never   Smokeless tobacco: Never  Vaping Use   Vaping Use: Never used  Substance and Sexual Activity   Alcohol use: Yes    Comment: occasional   Drug use: No   Sexual activity: Not on file  Other Topics Concern   Not on file  Social History Narrative   Not on file   Social Determinants of Health   Financial Resource Strain: Not on file  Food Insecurity: Not on file  Transportation Needs: Not on file  Physical Activity: Not on file  Stress: Not on file  Social Connections: Not on file  Intimate Partner Violence: Not on file    Vital Signs: Blood pressure 115/66, pulse (!) 107, resp. rate 16, height 5\' 7"  (1.702 m), weight 230 lb (104.3 kg), SpO2 95 %. Body mass index is 36.02 kg/m.    Examination: General Appearance: The patient is well-developed, well-nourished, and in no distress. Neck Circumference: 41 cm Skin: Gross inspection of skin unremarkable. Head: normocephalic, no gross deformities. Eyes: no gross deformities noted. ENT: ears appear grossly normal Neurologic: Alert and oriented. No involuntary movements.    EPWORTH SLEEPINESS SCALE:  Scale:  (0)= no chance of dozing; (1)= slight chance of dozing; (2)= moderate chance of dozing; (3)= high chance of dozing  Chance  Situtation    Sitting and reading: 0    Watching TV: 0    Sitting Inactive in public: 0    As a passenger in car: 1      Lying down to rest: 0    Sitting and talking: 0    Sitting quielty after lunch: 1    In a car, stopped in traffic: 0   TOTAL SCORE:   2 out of 24    SLEEP STUDIES:  PSG 04/18/06,  AHI 19.8,  Low SpO2 88%,    CPAP COMPLIANCE DATA:  Date Range: 03/26/20 - 03/25/2021  Average Daily Use: 8:28 hours  Median Use: 8:41  hours  Compliance for > 4 Hours: 92% days  AHI: 1.0 respiratory events per hour  Days Used: 364/365  Mask Leak: 26.9 lpm  95th Percentile Pressure: 14 cmH2O         LABS: No results found for this or any previous visit (from the past 2160 hour(s)).  Radiology: DG Chest 2 View  Result Date: 04/10/2020 CLINICAL DATA:  Cough short of breath and wheezing EXAM: CHEST - 2 VIEW COMPARISON:  04/29/2017 FINDINGS: The heart size and mediastinal contours are within normal limits. Both lungs are clear. Degenerative changes. IMPRESSION: No active cardiopulmonary disease. Electronically Signed   By: Jasmine Pang M.D.   On: 04/10/2020 16:43    No results found.  No results found.  Assessment and Plan: Patient Active Problem List   Diagnosis Date Noted   CPAP use counseling 01/27/2020   OSA on CPAP 01/27/2020   Chronic painful diabetic neuropathy ([place]) 05/09/2019   Chronic kidney disease, stage III (moderate) ([place]) 11/02/2018   Type 2 diabetes mellitus with diabetic chronic kidney disease ([place]) 11/02/2018   Chronic gouty arthropathy without tophi 06/19/2017   Gastroesophageal reflux disease without esophagitis 12/09/2013   Essential tremor 08/31/2012   Hypertension, essential, benign 09/15/2011    1. OSA on CPAP    The patient does tolerate PAP and reports  benefit from PAP use. The patient was reminded how to clean equipment and advised to replace supplies routinely. The patient was also counselled on weight loss. The compliance is excellent. The AHI is 1.0.   OSA- continue with excellent compliance. F/u one year.   2. CPAP use counseling- CPAP Counseling: had a lengthy discussion with the patient regarding the importance of PAP therapy in management of the sleep apnea. Patient appears to understand the risk factor reduction and also understands the risks associated with untreated sleep apnea. Patient will try to make a good faith effort to remain compliant with therapy.  Also instructed the patient on proper cleaning of the device including the water must be changed daily if possible and use of distilled water is preferred. Patient understands that the machine should be regularly cleaned with appropriate recommended cleaning solutions that do not damage the PAP machine for example given white vinegar and water rinses. Other methods such as ozone treatment may not be as good as these simple methods to achieve cleaning.  General Counseling: I have discussed the findings of the evaluation and examination with Chirstina.  I have also discussed any further diagnostic evaluation thatmay be needed or ordered today. Geroldine verbalizes understanding of the findings of todays visit. We also reviewed her medications today and discussed drug interactions and side effects including but not limited excessive drowsiness and altered mental states. We also discussed that there is always a risk not just to her but also people around her. she has been encouraged to call the office with any questions or concerns that should arise related to todays visit.  No orders of the defined types were placed in this encounter.       I have personally obtained a history, examined the patient, evaluated laboratory and imaging results, formulated the assessment and plan and placed orders. This patient was seen today by Tressie Ellis, PA-C in collaboration with Dr. Devona Konig.   Allyne Gee, MD [facility] Diplomate ABMS Pulmonary Critical Care Medicine and Sleep Medicine

## 2021-07-19 ENCOUNTER — Ambulatory Visit
Admission: RE | Admit: 2021-07-19 | Discharge: 2021-07-19 | Disposition: A | Discharge status: Home / Self Care | Payer: Medicare Other | Source: Ambulatory Visit | Attending: Student in an Organized Health Care Education/Training Program | Admitting: Student in an Organized Health Care Education/Training Program

## 2021-07-19 ENCOUNTER — Encounter: Payer: Self-pay | Admitting: Student in an Organized Health Care Education/Training Program

## 2021-07-19 ENCOUNTER — Ambulatory Visit
Payer: Medicare Other | Attending: Student in an Organized Health Care Education/Training Program | Admitting: Student in an Organized Health Care Education/Training Program

## 2021-07-19 VITALS — BP 112/60 | HR 92 | Temp 97.2°F | Resp 16 | Ht 68.0 in | Wt 229.0 lb

## 2021-07-19 DIAGNOSIS — M51369 Other intervertebral disc degeneration, lumbar region without mention of lumbar back pain or lower extremity pain: Secondary | ICD-10-CM

## 2021-07-19 DIAGNOSIS — M48062 Spinal stenosis, lumbar region with neurogenic claudication: Secondary | ICD-10-CM

## 2021-07-19 DIAGNOSIS — M5136 Other intervertebral disc degeneration, lumbar region: Secondary | ICD-10-CM | POA: Diagnosis not present

## 2021-07-19 DIAGNOSIS — G894 Chronic pain syndrome: Secondary | ICD-10-CM | POA: Diagnosis not present

## 2021-07-19 DIAGNOSIS — M47816 Spondylosis without myelopathy or radiculopathy, lumbar region: Secondary | ICD-10-CM | POA: Diagnosis not present

## 2021-07-19 NOTE — Progress Notes (Signed)
Patient: Chelsea Morrow  Service Category: E/M  Provider: Gillis Santa, MD  DOB: October 17, 1945  DOS: 07/19/2021  Referring Provider: Doyle Askew, MD  MRN: [id number]  Setting: Ambulatory outpatient  PCP: Chelsea Hartigan, MD  Type: New Patient  Specialty: Interventional Pain Management    Location: Office  Delivery: Face-to-face     Primary Reason(s) for Visit: Encounter for initial evaluation of one or more chronic problems (new to examiner) potentially causing chronic pain, and posing a threat to normal musculoskeletal function. (Level of risk: High) CC: Back Pain (lower)  HPI  Chelsea Morrow is a 76 y.o. year old, female patient, who comes for the first time to our practice referred by Chelsea Hake I, MD for our initial evaluation of her chronic pain. She has CPAP use counseling; OSA on CPAP; Chronic gouty arthropathy without tophi; Chronic kidney disease, stage III (moderate) ([place]); Chronic painful diabetic neuropathy ([place]); Gastroesophageal reflux disease without esophagitis; Hypertension, essential, benign; Essential tremor; Type 2 diabetes mellitus with diabetic chronic kidney disease ([place]); Lumbar spondylosis; Spinal stenosis, lumbar region, with neurogenic claudication; Lumbar degenerative disc disease; and Chronic pain syndrome on their problem list. Today she comes in for evaluation of her Back Pain (lower)  Pain Assessment: Location: Lower Back Radiating: Denies Onset: More than a month ago Duration: Chronic pain Quality: Sharp, Aching, Constant, Discomfort, Pressure Severity: 2 /10 (subjective, self-reported pain score)  Effect on ADL: limits my daily activities Timing: Constant Modifying factors: sitting in reclinger, laying down BP: 112/60  HR: 92  Onset and Duration: Sudden Cause of pain: Unknown Severity: Getting worse, NAS-11 at its worse: 6/10, NAS-11 at its best: 2/10, NAS-11 now: 2/10, and NAS-11 on the average: 2/10 Timing: During activity or exercise and After  activity or exercise Aggravating Factors: Bending, Prolonged standing, and Stooping  Alleviating Factors: Lying down, Resting, and Sleeping Associated Problems: Pain that wakes patient up Quality of Pain: Agonizing, Disabling, and Sharp Previous Examinations or Tests: X-rays Previous Treatments: Epidural steroid injections, Physical Therapy, and Radiofrequency  [name] is a pleasant 76 year old female with a chief complaint of axial low back pain.  She was previously being managed at physical medicine and rehab with Chelsea Morrow.  She has a history of lumbar facet arthropathy, lumbar spondylosis.  She also has associated lumbar neuroforaminal stenosis.  She is currently working with physical therapy and has also done so in the past.  She has a history of chronic kidney disease.  She also has type 2 diabetes with A1c of 7.3.    Her interventional procedural history is as below:  Chronic low back pain with left lumbar radiculitis -s/p bilateral L5-S1 TFESI 01/25/2021 with no significant relief -s/p bilateral L3, L4 medial branch and dorsal rami RFA 04/29/2021 with no relief relief -s/p bilateral L5-S1 TFESI with [place] 06/14/2021 with no significant relief  She states that she is here to discuss stimulation options to help manage her low back pain.  She is not interested in surgery.   Meds   Current Outpatient Medications:    allopurinol (ZYLOPRIM) 300 MG tablet, Take 300 mg by mouth daily., Disp: , Rfl:    DULoxetine (CYMBALTA) 60 MG capsule, Take 60 mg by mouth daily., Disp: , Rfl:    gabapentin (NEURONTIN) 300 MG capsule, Take 3 capsules by mouth 2 (two) times daily., Disp: , Rfl:    glipiZIDE (GLUCOTROL) 5 MG tablet, glipizide 5 mg tablet, Disp: , Rfl:    HYDROcodone-acetaminophen (NORCO/VICODIN) 5-325 MG tablet, Take 1 tablet by  mouth every 6 (six) hours as needed., Disp: , Rfl:    lisinopril (ZESTRIL) 20 MG tablet, Take 20 mg by mouth daily., Disp: , Rfl:    metFORMIN (GLUCOPHAGE) 500 MG  tablet, Take 500 mg by mouth 2 (two) times daily., Disp: , Rfl:    Multiple Vitamin (MULTIVITAMIN WITH MINERALS) TABS tablet, Take 1 tablet by mouth daily., Disp: , Rfl:    pravastatin (PRAVACHOL) 20 MG tablet, Take 20 mg by mouth daily., Disp: , Rfl:    primidone (MYSOLINE) 50 MG tablet, Take by mouth 2 (two) times daily., Disp: , Rfl:    topiramate (TOPAMAX) 100 MG tablet, Take 200 mg by mouth at bedtime., Disp: , Rfl:    vitamin B-12 (CYANOCOBALAMIN) 1000 MCG tablet, Take 1,000 mcg by mouth daily., Disp: , Rfl:    glipiZIDE (GLUCOTROL) 5 MG tablet, Take 1 tablet (5 mg total) by mouth 2 (two) times daily., Disp: 60 tablet, Rfl: 11  Imaging Review   MR LUMBAR SPINE WO CONTRAST  Narrative CLINICAL DATA:  Spinal stenosis of the lumbar region with neurogenic claudication.  EXAM: MRI LUMBAR SPINE WITHOUT CONTRAST  TECHNIQUE: Multiplanar, multisequence MR imaging of the lumbar spine was performed. No intravenous contrast was administered.  COMPARISON:  None.  FINDINGS: Segmentation:  Standard.  Alignment:  Minimal anterolisthesis of L3 over L4.  Vertebrae:  No fracture, evidence of discitis, or bone lesion.  Conus medullaris and cauda equina: Conus extends to the L1 level. Conus and cauda equina appear normal.  Paraspinal and other soft tissues: Small left renal cysts.  Disc levels:  T12-L1: No spinal canal or neural foraminal stenosis.  L1-2: Shallow disc bulge and mild facet degenerative changes without significant spinal canal or neural foraminal stenosis.  L2-3: Shallow disc bulge with central annular tear, mild facet degenerative changes and ligamentum flavum redundancy without significant spinal canal or neural foraminal stenosis.  L3-4: Shallow disc bulge, hypertrophic facet degenerative changes, right greater than left and ligamentum flavum redundancy resulting in mild spinal canal stenosis. No significant neural foraminal narrowing.  L4-5: Disc bulge with  superimposed small central disc protrusion, hypertrophic facet degenerative change ligamentum flavum redundancy resulting in mild-to-moderate spinal canal stenosis with narrowing of the bilateral subarticular zones, left greater than right. No significant neural foraminal narrowing.  L5-S1: Mild facet degenerative changes, left greater than right. No significant spinal canal or neural foraminal stenosis.  IMPRESSION: 1. Multilevel degenerative changes of the lumbar spine, worst at L4-L5 where there is mild-to-moderate spinal canal stenosis with narrowing of the bilateral subarticular zones, left greater than right. 2. Mild spinal canal stenosis at L3-L4. 3. No significant neural foraminal narrowing at any level.   Electronically Signed By: Pedro Earls M.D. On: 07/10/2019 14:00   Complexity Note: Imaging results reviewed. Results shared with Ms. Darrick Grinder, using Layman's terms.                         ROS  Cardiovascular: High blood pressure Pulmonary or Respiratory: Temporary stoppage of breathing during sleep Neurological: Abnormal skin sensations (Peripheral Neuropathy) Psychological-Psychiatric: No reported psychological or psychiatric signs or symptoms such as difficulty sleeping, anxiety, depression, delusions or hallucinations (schizophrenial), mood swings (bipolar disorders) or suicidal ideations or attempts Gastrointestinal: No reported gastrointestinal signs or symptoms such as vomiting or evacuating blood, reflux, heartburn, alternating episodes of diarrhea and constipation, inflamed or scarred liver, or pancreas or irrregular and/or infrequent bowel movements Genitourinary: Kidney disease Hematological: No reported hematological signs or  symptoms such as prolonged bleeding, low or poor functioning platelets, bruising or bleeding easily, hereditary bleeding problems, low energy levels due to low hemoglobin or being anemic Endocrine: High blood sugar requiring  insulin (IDDM) Rheumatologic: No reported rheumatological signs and symptoms such as fatigue, joint pain, tenderness, swelling, redness, heat, stiffness, decreased range of motion, with or without associated rash Musculoskeletal: Negative for myasthenia gravis, muscular dystrophy, multiple sclerosis or malignant hyperthermia Work History: Retired  Allergies  Ms. Port is allergic to bactrim [sulfamethoxazole-trimethoprim], levofloxacin, fenofibrate, and tramadol.  Laboratory Chemistry Profile   Renal Lab Results  Component Value Date   BUN 54 (H) 03/19/2019   CREATININE 1.93 (H) 03/19/2019   GFRAA 29 (L) 03/19/2019   GFRNONAA 25 (L) 03/19/2019   PROTEINUR NEGATIVE 03/19/2019     Electrolytes Lab Results  Component Value Date   NA 129 (L) 03/19/2019   K 4.0 03/19/2019   CL 92 (L) 03/19/2019   CALCIUM 9.8 03/19/2019     Hepatic Lab Results  Component Value Date   AST 19 03/19/2019   ALT 21 03/19/2019   ALBUMIN 4.1 03/19/2019   ALKPHOS 116 03/19/2019     ID Lab Results  Component Value Date   SARSCOV2NAA NEGATIVE 04/10/2020     Bone No results found for: [place], VD125OH2TOT, YN8295AO1, HY8657QI6, 25OHVITD1, 25OHVITD2, 25OHVITD3, TESTOFREE, TESTOSTERONE   Endocrine Lab Results  Component Value Date   GLUCOSE 566 (HH) 03/19/2019   GLUCOSEU >=500 (A) 03/19/2019     Neuropathy No results found for: VITAMINB12, FOLATE, HGBA1C, HIV   CNS No results found for: COLORCSF, APPEARCSF, RBCCOUNTCSF, WBCCSF, POLYSCSF, LYMPHSCSF, EOSCSF, PROTEINCSF, GLUCCSF, JCVIRUS, CSFOLI, IGGCSF, LABACHR, ACETBL, LABACHR, ACETBL   Inflammation (CRP: Acute  ESR: Chronic) No results found for: CRP, ESRSEDRATE, LATICACIDVEN   Rheumatology No results found for: RF, ANA, LABURIC, URICUR, LYMEIGGIGMAB, LYMEABIGMQN, HLAB27   Coagulation Lab Results  Component Value Date   PLT 204 03/19/2019     Cardiovascular Lab Results  Component Value Date   HGB 12.2 03/19/2019   HCT 38.5  03/19/2019     Screening Lab Results  Component Value Date   SARSCOV2NAA NEGATIVE 04/10/2020     Cancer No results found for: CEA, CA125, LABCA2   Allergens No results found for: ALMOND, APPLE, ASPARAGUS, AVOCADO, BANANA, BARLEY, BASIL, BAYLEAF, GREENBEAN, LIMABEAN, WHITEBEAN, BEEFIGE, REDBEET, BLUEBERRY, BROCCOLI, CABBAGE, MELON, CARROT, CASEIN, CASHEWNUT, CAULIFLOWER, CELERY     Note: Lab results reviewed.  [place]  Drug: Ms. Loree  reports no history of drug use. Alcohol:  reports current alcohol use. Tobacco:  reports that she has never smoked. She has never used smokeless tobacco. Medical:  has a past medical history of Diabetes mellitus without complication ([place]), Gout, Hypertension, and Neuropathy. Family: family history is not on file.  Past Surgical History:  Procedure Laterality Date   ABDOMINAL HYSTERECTOMY     CESAREAN SECTION     Active Ambulatory Problems    Diagnosis Date Noted   CPAP use counseling 01/27/2020   OSA on CPAP 01/27/2020   Chronic gouty arthropathy without tophi 06/19/2017   Chronic kidney disease, stage III (moderate) ([facility]) 11/02/2018   Chronic painful diabetic neuropathy ([place]) 05/09/2019   Gastroesophageal reflux disease without esophagitis 12/09/2013   Hypertension, essential, benign 09/15/2011   Essential tremor 08/31/2012   Type 2 diabetes mellitus with diabetic chronic kidney disease ([place]) 11/02/2018   Lumbar spondylosis 07/19/2021   Spinal stenosis, lumbar region, with neurogenic claudication 07/19/2021   Lumbar degenerative disc disease 07/19/2021   Chronic  pain syndrome 07/19/2021   Resolved Ambulatory Problems    Diagnosis Date Noted   No Resolved Ambulatory Problems   Past Medical History:  Diagnosis Date   Diabetes mellitus without complication ([place])    Gout    Hypertension    Neuropathy    Constitutional Exam  General appearance: Well nourished, well developed, and well hydrated. In no apparent acute distress Vitals:    07/19/21 1322  BP: 112/60  Pulse: 92  Resp: 16  Temp: (!) 97.2 F (36.2 C)  TempSrc: Temporal  SpO2: 96%  Weight: 229 lb (103.9 kg)  Height: '5\' 8"'  (1.727 m)   BMI Assessment: Estimated body mass index is 34.82 kg/m as calculated from the following:   Height as of this encounter: '5\' 8"'  (1.727 m).   Weight as of this encounter: 229 lb (103.9 kg).  BMI interpretation table: BMI level Category Range association with higher incidence of chronic pain  <18 kg/m2 Underweight   18.5-24.9 kg/m2 Ideal body weight   25-29.9 kg/m2 Overweight Increased incidence by 20%  30-34.9 kg/m2 Obese (Class Morrow) Increased incidence by 68%  35-39.9 kg/m2 Severe obesity (Class II) Increased incidence by 136%  >40 kg/m2 Extreme obesity (Class III) Increased incidence by 254%   Patient's current BMI Ideal Body weight  Body mass index is 34.82 kg/m. Ideal body weight: 63.9 kg (140 lb 14 oz) Adjusted ideal body weight: 79.9 kg (176 lb 2 oz)   BMI Readings from Last 4 Encounters:  07/19/21 34.82 kg/m  03/29/21 36.02 kg/m  04/10/20 36.49 kg/m  01/27/20 36.34 kg/m   Wt Readings from Last 4 Encounters:  07/19/21 229 lb (103.9 kg)  03/29/21 230 lb (104.3 kg)  04/10/20 233 lb (105.7 kg)  01/27/20 232 lb (105.2 kg)    Psych/Mental status: Alert, oriented x 3 (person, place, & time)       Eyes: PERLA Respiratory: No evidence of acute respiratory distress  Thoracic Spine Area Exam  Skin & Axial Inspection: No masses, redness, or swelling Alignment: Symmetrical Functional ROM: Unrestricted ROM Stability: No instability detected Muscle Tone/Strength: Functionally intact. No obvious neuro-muscular anomalies detected. Sensory (Neurological): Unimpaired Muscle strength & Tone: No palpable anomalies Lumbar Spine Area Exam  Skin & Axial Inspection: No masses, redness, or swelling Alignment: Symmetrical Functional ROM: Pain restricted ROM affecting both sides Stability: No instability detected Muscle  Tone/Strength: Functionally intact. No obvious neuro-muscular anomalies detected. Sensory (Neurological): Musculoskeletal pain pattern Palpation: Complains of area being tender to palpation       Provocative Tests: Hyperextension/rotation test: (+) bilaterally for facet joint pain. Lumbar quadrant test (Kemp's test): (+) bilaterally for facet joint pain.  Gait & Posture Assessment  Ambulation: Unassisted Gait: Relatively normal for age and body habitus Posture: WNL  Lower Extremity Exam    Side: Right lower extremity  Side: Left lower extremity  Stability: No instability observed          Stability: No instability observed          Skin & Extremity Inspection: Skin color, temperature, and hair growth are WNL. No peripheral edema or cyanosis. No masses, redness, swelling, asymmetry, or associated skin lesions. No contractures.  Skin & Extremity Inspection: Skin color, temperature, and hair growth are WNL. No peripheral edema or cyanosis. No masses, redness, swelling, asymmetry, or associated skin lesions. No contractures.  Functional ROM: Unrestricted ROM                  Functional ROM: Unrestricted ROM  Muscle Tone/Strength: Functionally intact. No obvious neuro-muscular anomalies detected.  Muscle Tone/Strength: Functionally intact. No obvious neuro-muscular anomalies detected.  Sensory (Neurological): Unimpaired        Sensory (Neurological): Unimpaired        DTR: Patellar: deferred today Achilles: deferred today Plantar: deferred today  DTR: Patellar: deferred today Achilles: deferred today Plantar: deferred today  Palpation: No palpable anomalies  Palpation: No palpable anomalies    Assessment  Primary Diagnosis & Pertinent Problem List: The primary encounter diagnosis was Lumbar facet arthropathy. Diagnoses of Lumbar spondylosis, Spinal stenosis, lumbar region, with neurogenic claudication, Lumbar degenerative disc disease, and Chronic pain syndrome were also  pertinent to this visit.  Visit Diagnosis (New problems to examiner): 1. Lumbar facet arthropathy   2. Lumbar spondylosis   3. Spinal stenosis, lumbar region, with neurogenic claudication   4. Lumbar degenerative disc disease   5. Chronic pain syndrome    Plan of Care (Initial workup plan)  Morrow had a long discussion with the patient and reviewed lumbar medial branch peripheral nerve stimulation with her via [employer] which is a company that we use.  Morrow explained to her on a spine model what peripheral nerve stimulation entails, where the leads were placed as well as care of the battery pack and remote.  Risk and benefits were discussed at length and patient would like to proceed with L4 medial branch peripheral nerve stimulation.  Orders Placed This Encounter  Procedures   Implantable Peripheral Nerve Stimulator    Standing Status:   Future    Standing Expiration Date:   09/18/2021    Scheduling Instructions:     Procedure: Temporary implantable peripheral nerve stimulator     Left L4 PNS     LOCAL only     Timeframe: ASAA    Order Specific Question:   Location of Procedure    Answer:   [facility]   Implantable Peripheral Nerve Stimulator    Standing Status:   Future    Standing Expiration Date:   09/18/2021    Scheduling Instructions:     Procedure: Temporary implantable peripheral nerve stimulator     Side:  RIGHT L4     Sedation: LOCAL      Timeframe: ASAA    Order Specific Question:   Location of Procedure    Answer:   [facility]   DG PAIN CLINIC C-ARM 1-60 MIN NO REPORT    Intraoperative interpretation by procedural physician at [place].    Standing Status:   Standing    Number of Occurrences:   1    Order Specific Question:   Reason for exam:    Answer:   Assistance in needle guidance and placement for procedures requiring needle placement in or near specific anatomical locations not easily accessible without such assistance.      Imaging Orders         DG PAIN CLINIC C-ARM 1-60 MIN NO REPORT      Procedure Orders         Implantable Peripheral Nerve Stimulator         Implantable Peripheral Nerve Stimulator      Provider-requested follow-up: Return in about 4 weeks (around 08/16/2021) for Left L4 PNS ([employer]), in clinic [facility].  Morrow spent a total of 60 minutes reviewing chart data, face-to-face evaluation with the patient, counseling and coordination of care as detailed above.   No future appointments.   Note by: Chelsea Santa, MD Date: 07/19/2021;  Time: 2:18 PM

## 2021-07-19 NOTE — Progress Notes (Signed)
Safety precautions to be maintained throughout the outpatient stay will include: orient to surroundings, keep bed in low position, maintain call bell within reach at all times, provide assistance with transfer out of bed and ambulation.  

## 2021-08-04 ENCOUNTER — Encounter: Payer: Self-pay | Admitting: Student in an Organized Health Care Education/Training Program

## 2021-08-04 ENCOUNTER — Ambulatory Visit (HOSPITAL_BASED_OUTPATIENT_CLINIC_OR_DEPARTMENT_OTHER): Payer: Medicare Other | Admitting: Student in an Organized Health Care Education/Training Program

## 2021-08-04 ENCOUNTER — Other Ambulatory Visit: Payer: Self-pay | Admitting: Student in an Organized Health Care Education/Training Program

## 2021-08-04 ENCOUNTER — Ambulatory Visit
Admission: RE | Admit: 2021-08-04 | Discharge: 2021-08-04 | Disposition: A | Discharge status: Home / Self Care | Payer: Medicare Other | Source: Ambulatory Visit | Attending: Student in an Organized Health Care Education/Training Program | Admitting: Student in an Organized Health Care Education/Training Program

## 2021-08-04 DIAGNOSIS — R52 Pain, unspecified: Secondary | ICD-10-CM | POA: Insufficient documentation

## 2021-08-04 DIAGNOSIS — M47816 Spondylosis without myelopathy or radiculopathy, lumbar region: Secondary | ICD-10-CM | POA: Diagnosis present

## 2021-08-04 DIAGNOSIS — M5136 Other intervertebral disc degeneration, lumbar region: Secondary | ICD-10-CM

## 2021-08-04 DIAGNOSIS — G894 Chronic pain syndrome: Secondary | ICD-10-CM | POA: Diagnosis present

## 2021-08-04 MED ORDER — ROPIVACAINE HCL 2 MG/ML IJ SOLN
INTRAMUSCULAR | Status: AC
  Filled 2021-08-04: qty 20

## 2021-08-04 MED ORDER — LIDOCAINE HCL 2 % IJ SOLN
20.0000 mL | Freq: Once | INTRAMUSCULAR | Status: AC
Start: 2021-08-04 — End: 2021-08-04
  Administered 2021-08-04: 400 mg

## 2021-08-04 MED ORDER — ROPIVACAINE HCL 2 MG/ML IJ SOLN
9.0000 mL | Freq: Once | INTRAMUSCULAR | Status: AC
Start: 2021-08-04 — End: 2021-08-04
  Administered 2021-08-04: 9 mL via PERINEURAL

## 2021-08-04 MED ORDER — LIDOCAINE HCL 2 % IJ SOLN
INTRAMUSCULAR | Status: AC
  Filled 2021-08-04: qty 20

## 2021-08-04 NOTE — Patient Instructions (Signed)

## 2021-08-04 NOTE — Progress Notes (Signed)
Safety precautions to be maintained throughout the outpatient stay will include: orient to surroundings, keep bed in low position, maintain call bell within reach at all times, provide assistance with transfer out of bed and ambulation.  

## 2021-08-04 NOTE — Progress Notes (Signed)
PROVIDER NOTE: Interpretation of information contained herein should be left to medically-trained personnel. Specific patient instructions are provided elsewhere under "Patient Instructions" section of medical record. This document was created in part using STT-dictation technology, any transcriptional errors that may result from this process are unintentional.  Patient: Chelsea Morrow Type: Established DOB: 1945-11-16 MRN: [id number] PCP: Marina Goodell, MD  Service: Procedure DOS: 08/04/2021 Setting: Ambulatory Location: Ambulatory outpatient facility Delivery: Face-to-face Provider: Edward Jolly, MD Specialty: Interventional Pain Management Specialty designation: 09 Location: Outpatient facility Ref. Prov.: Edward Jolly, MD    Primary Reason for Admission: Surgical management of chronic pain condition.  Procedure:  Anesthesia, Analgesia, Anxiolysis:  Type: SPRINTTM Peripheral Nerve Field Stimulator (PNS) MicroLeadTM Implant Purpose: Therapeutic Region: Lumbar Level: L4-5 Facet Medial Branch Nerve Laterality: Left           Anesthesia: Local (1-2% Lidocaine)  Anxiolysis: None  Sedation: None  Guidance: Fluoroscopy            1. Lumbar facet arthropathy   2. Lumbar spondylosis   3. Lumbar degenerative disc disease   4. Chronic pain syndrome    NAS-11 Pain score:   Pre-procedure: 0-No pain/10   Post-procedure: 0-No pain/10   Pre-op H&P Assessment:  Chelsea Morrow is a 76 y.o. (year old), female patient, seen today for interventional treatment. She  has a past surgical history that includes Abdominal hysterectomy and Cesarean section.  Initial Vital Signs:  Pulse/EKG Rate: 95  Temp: (!) 97 F (36.1 C) Resp: 16 BP: 109/80 SpO2: 99 %  BMI: Estimated body mass index is 34.82 kg/m as calculated from the following:   Height as of this encounter: 5\' 8"  (1.727 m).   Weight as of this encounter: 229 lb (103.9 kg).  Risk Assessment: Allergies: Reviewed. She is allergic to  bactrim [sulfamethoxazole-trimethoprim], levofloxacin, fenofibrate, and tramadol.  Allergy Precautions: None required Coagulopathies: Reviewed. None identified.  Blood-thinner therapy: None at this time Active Infection(s): Reviewed. None identified. Chelsea Morrow is afebrile  Site Confirmation: Chelsea Morrow was asked to confirm the procedure and laterality before marking the site, which she did. Procedure checklist: Completed Consent: Before the procedure and under the influence of no sedative(s), amnesic(s), or anxiolytics, the patient was informed of the treatment options, risks and possible complications. To fulfill our ethical and legal obligations, as recommended by the American Medical Association's Code of Ethics, I have informed the patient of my clinical impression; the nature and purpose of the treatment or procedure; the risks, benefits, and possible complications of the intervention; the alternatives, including doing nothing; the risk(s) and benefit(s) of the alternative treatment(s) or procedure(s); and the risk(s) and benefit(s) of doing nothing.  Chelsea Morrow was provided with information about the general risks and possible complications associated with most interventional procedures. These include, but are not limited to: failure to achieve desired goals, infection, bleeding, organ or nerve damage, allergic reactions, paralysis, and/or death.  In addition, she was informed of those risks and possible complications associated to this particular procedure, which include, but are not limited to: damage to the implant; failure to decrease pain; local, systemic, or serious CNS infections, intraspinal abscess with possible cord compression and paralysis, or life-threatening such as meningitis; intrathecal and/or epidural bleeding with formation of hematoma with possible spinal cord compression and permanent paralysis; organ damage; nerve injury or damage with subsequent sensory, motor, and/or autonomic  system dysfunction, resulting in transient or permanent pain, numbness, and/or weakness of one or several areas of the body; allergic reactions,  either minor or major life-threatening, such as anaphylactic or anaphylactoid reactions.  Furthermore, Chelsea Morrow was informed of those risks and complications associated with the medications. These include, but are not limited to: allergic reactions (i.e.: anaphylactic or anaphylactoid reactions); arrhythmia;  Hypotension/hypertension; cardiovascular collapse; respiratory depression and/or shortness of breath; swelling or edema; medication-induced neural toxicity; particulate matter embolism and blood vessel occlusion with resultant organ, and/or nervous system infarction and permanent paralysis.  Finally, she was informed that Medicine is not an exact science; therefore, there is also the possibility of unforeseen or unpredictable risks and/or possible complications that may result in a catastrophic outcome. The patient indicated having understood very clearly. We have given the patient no guarantees and we have made no promises. Enough time was given to the patient to ask questions, all of which were answered to the patient's satisfaction. Chelsea Morrow has indicated that she wanted to continue with the procedure. Attestation: I, the ordering provider, attest that I have discussed with the patient the benefits, risks, side-effects, alternatives, likelihood of achieving goals, and potential problems during recovery for the procedure that I have provided informed consent. Date  Time: 08/04/2021  9:20 AM  Pre-Procedure Preparation:  Monitoring: As per clinic protocol. Respiration, ETCO2, SpO2, BP, heart rate and rhythm monitor placed and checked for adequate function Safety Precautions: Patient was assessed for positional comfort and pressure points before starting the procedure. Time-out: I initiated and conducted the "Time-out" before starting the procedure, as per  protocol. The patient was asked to participate by confirming the accuracy of the "Time Out" information. Verification of the correct person, site, and procedure were performed and confirmed by me, the nursing staff, and the patient. "Time-out" conducted as per Joint Commission's Universal Protocol (UP.01.01.01). Time: 1049  Description of Procedure Process:   Position: Prone Target Area: <1 cm from targeted nerve (Medial Branch Nerve) Approach: Posterior percutaneous, paramedial, interlaminar approach Area Prepped: Entire Lumbar Region Prepping solution: ChloraPrep (2% chlorhexidine gluconate and 70% isopropyl alcohol) Safety Precautions: Safe injection practices and needle disposal techniques used. Medications properly checked for expiration dates. SDV (single dose vial) medications used. Aspiration looking for blood return and/or CSF was conducted prior to all injections. At no point did I inject any substances, as a needle was being advanced. No attempts were made at seeking any paresthesias.  Description of the Procedure (Lumbar Medial Branch):  Availability of a responsible, adult driver, and NPO status confirmed. Informed consent was obtained after having discussed risks and possible complications. An IV was started. The patient was then taken to the fluoroscopy suite, where the patient was placed in position for the procedure, over the fluoroscopy table. The patient was then monitored in the usual manner. Fluoroscopy was manipulated to obtain the best possible view of the target. Parallex error was corrected before commencing the procedure. Once a clear view of the target had been obtained, the skin and subcutaneous tissue over the procedure site were infiltrated using lidocaine, loaded in a 10 cc luer-loc syringe with a 0.5 inch, 25-G needle. Care was taken to avoid numbing deeper tissues The introducer needle(s) was/were then inserted through the skin and deeper tissues, specifically the  multifidus muscle.  An introducer needle and stimulating probe were  assembled, inserted and advanced along the intended course of the medial branch nerve as it traverses the lamina medial and inferior to the zygapophyseal joint, taking care to maintain the proper depth of insertion as the introducer is advanced under fluoroscopic. The introducer needle was delivered  to a location in proximity to the nerve. Multiple stimulation parameters were used to deliver stimulation to the medial branch nerve in concert with stimulating at multiple positions around the nerve. Nerve target acquisition was confirmed noting generation of paresthesias in the paravertebral regions corresponding to the level being stimulated as well as rhythmic thumping within the multifidi, the latter being further corroborated via palpation.  Various electrical parameter combinations were tested, and the lead location was adjusted (physically relocated) until the patient indicated paresthesia or muscle tension overlapping the distribution of the patient's typical region of pain.  The stimulating probe was removed from the introducer and a percutaneous lead was guided through the needle and delivered to a location in similar proximity to the nerve. Final  location was verified with electrical stimulation and documented with fluoroscopy & ultrasound.  The introducer needle was removed, and the exposed end of the percutaneous lead was attached to an external stimulator unit. Various electrical parameter combinations were again  tested until the patient indicated paresthesia or muscle tension  overlapping the distribution of the patient's typical region of pain.  After confirming that lead impedance was in the normal range, the external unit was detached, the needle was removed, and the lead was anchored at the skin. The lead was threaded into the connector block and electrical continuity and desired patient response was confirmed. The connector  block was attached to the external  stimulator unit.The site was covered with a sterile occlusive dressing and  a fluoroscopic and ultrasound image was taken to document final placement. The patient was observed for stability of vital signs and comfort.     Vitals:   08/04/21 0938 08/04/21 1044 08/04/21 1054  BP: 109/80 113/76 115/81  Pulse: 95 88 84  Resp:  16 20  Temp: (!) 97 F (36.1 C)    SpO2: 99% 98% 96%  Weight: 229 lb (103.9 kg)    Height: 5\' 8"  (1.727 m)     Start Time: 1049 hrs. End Time: 1059 hrs.  Imaging Guidance (Spinal):          Type of Imaging Technique: Fluoroscopy Guidance (Spinal) and ultrasound guidance to confirm multifidus activation after lead placement. Indication(s): Assistance in needle guidance and placement for procedures requiring needle placement in or near specific anatomical locations not easily accessible without such assistance. Exposure Time: Please see nurses notes. Contrast: None used. Fluoroscopic Guidance: I was personally present during the use of fluoroscopy. "Tunnel Vision Technique" used to obtain the best possible view of the target area. Parallax error corrected before commencing the procedure. "Direction-depth-direction" technique used to introduce the needle under continuous pulsed fluoroscopy. Once target was reached, antero-posterior, oblique, and lateral fluoroscopic projection used confirm needle placement in all planes. Images permanently stored in EMR. Interpretation: No contrast injected. I personally interpreted the imaging intraoperatively. Adequate needle placement confirmed in multiple planes. Permanent images saved into the patient's record.  Antibiotic Prophylaxis:   Anti-infectives (From admission, onward)    None      Indication(s): None identified  Post-operative Assessment:  Post-procedure Vital Signs:  Pulse/HCG Rate: 84 (SV rhythm)  Temp: (!) 97 F (36.1 C) Resp: 20 BP: 115/81 SpO2: 96 %  Complications:  No immediate post-treatment complications observed by team, or reported by patient.  Note: The patient tolerated the entire procedure well. A repeat set of vitals were taken after the procedure and the patient was kept under observation following institutional policy, for this type of procedure. Post-procedural neurological assessment was performed, showing  return to baseline, prior to discharge. The patient was provided with post-procedure discharge instructions, including a section on how to identify potential problems. Should any problems arise concerning this procedure, the patient was given instructions to immediately contact us, at any time, without hesitation. In any case, we plan to contact the patient by telephone for a follow-up status report regarding this interventional procedure.  Comments:  No additional relevant information.  Plan of Care    Medications administered: We administered lidocaine and ropivacaine (PF) 2 mg/mL (0.2%).  See the medical record for exact dosing, route, and time of administration.  Follow-up plan:   Return for Keep sch. appt R L4 PNS.     Recent Visits Date Type Provider Dept  07/19/21 Office Visit Edward Jolly, MD [facility]  Showing recent visits within past 90 days and meeting all other requirements Today's Visits Date Type Provider Dept  08/04/21 Procedure visit Edward Jolly, MD [facility]  Showing today's visits and meeting all other requirements Future Appointments Date Type Provider Dept  08/25/21 Appointment Edward Jolly, MD [facility]  Showing future appointments within next 90 days and meeting all other requirements  Disposition: Discharge home  Discharge (Date  Time): 08/04/2021; 1120 hrs.   Primary Care Physician: Marina Goodell, MD Location: [facility] Note by: Edward Jolly, MD Date: 08/04/2021; Time: 2:54 PM

## 2021-08-05 ENCOUNTER — Telehealth: Payer: Self-pay

## 2021-08-05 NOTE — Telephone Encounter (Signed)
Post procedure phone call.   No answer.  

## 2021-08-09 ENCOUNTER — Telehealth: Payer: Self-pay | Admitting: Student in an Organized Health Care Education/Training Program

## 2021-08-25 ENCOUNTER — Ambulatory Visit: Payer: Medicare Other | Admitting: Student in an Organized Health Care Education/Training Program

## 2021-09-06 ENCOUNTER — Ambulatory Visit
Payer: Medicare Other | Attending: Student in an Organized Health Care Education/Training Program | Admitting: Student in an Organized Health Care Education/Training Program

## 2021-09-06 ENCOUNTER — Ambulatory Visit
Admission: RE | Admit: 2021-09-06 | Discharge: 2021-09-06 | Disposition: A | Discharge status: Home / Self Care | Payer: Medicare Other | Source: Ambulatory Visit | Attending: Student in an Organized Health Care Education/Training Program | Admitting: Student in an Organized Health Care Education/Training Program

## 2021-09-06 ENCOUNTER — Encounter: Payer: Self-pay | Admitting: Student in an Organized Health Care Education/Training Program

## 2021-09-06 DIAGNOSIS — M47816 Spondylosis without myelopathy or radiculopathy, lumbar region: Secondary | ICD-10-CM | POA: Insufficient documentation

## 2021-09-06 DIAGNOSIS — M5136 Other intervertebral disc degeneration, lumbar region: Secondary | ICD-10-CM | POA: Diagnosis present

## 2021-09-06 DIAGNOSIS — G894 Chronic pain syndrome: Secondary | ICD-10-CM | POA: Insufficient documentation

## 2021-09-06 MED ORDER — LIDOCAINE HCL 2 % IJ SOLN
20.0000 mL | Freq: Once | INTRAMUSCULAR | Status: AC
Start: 2021-09-06 — End: 2021-09-06
  Administered 2021-09-06: 400 mg
  Filled 2021-09-06: qty 20

## 2021-09-06 MED ORDER — ROPIVACAINE HCL 2 MG/ML IJ SOLN
2.0000 mL | Freq: Once | INTRAMUSCULAR | Status: AC
Start: 2021-09-06 — End: 2021-09-06
  Administered 2021-09-06: 2 mL via EPIDURAL
  Filled 2021-09-06: qty 20

## 2021-09-06 NOTE — Progress Notes (Signed)
Safety precautions to be maintained throughout the outpatient stay will include: orient to surroundings, keep bed in low position, maintain call bell within reach at all times, provide assistance with transfer out of bed and ambulation.  

## 2021-09-06 NOTE — Progress Notes (Signed)
PROVIDER NOTE: Interpretation of information contained herein should be left to medically-trained personnel. Specific patient instructions are provided elsewhere under "Patient Instructions" section of medical record. This document was created in part using STT-dictation technology, any transcriptional errors that may result from this process are unintentional.  Patient: Chelsea Morrow Type: Established DOB: Apr 15, 1945 MRN: [id number] PCP: Marina Goodell, MD  Service: Procedure DOS: 09/06/2021 Setting: Ambulatory Location: Ambulatory outpatient facility Delivery: Face-to-face Provider: Edward Jolly, MD Specialty: Interventional Pain Management Specialty designation: 09 Location: Outpatient facility Ref. Prov.: Edward Jolly, MD    Primary Reason for Admission: Surgical management of chronic pain condition.  Procedure:  Anesthesia, Analgesia, Anxiolysis:  Type: SPRINTTM Peripheral Nerve Field Stimulator (PNS) MicroLeadTM Implant Purpose: Therapeutic Region: Lumbar Level: L4-5 Facet Medial Branch Nerve Laterality: Right           Anesthesia: Local (1-2% Lidocaine)  Anxiolysis: None  Sedation: None  Guidance: Fluoroscopy & [place] CPT (93734)    1. Lumbar facet arthropathy   2. Lumbar spondylosis   3. Lumbar degenerative disc disease   4. Chronic pain syndrome    NAS-11 Pain score:   Pre-procedure: 5 /10   Post-procedure: 0-No pain/10   Pre-op H&P Assessment:  Chelsea Morrow is a 76 y.o. (year old), female patient, seen today for interventional treatment. She  has a past surgical history that includes Abdominal hysterectomy and Cesarean section.  Initial Vital Signs:  Pulse/EKG Rate: 92  Temp: (!) 97.5 F (36.4 C) Resp: 16 BP: 123/79 SpO2: 96 %  BMI: Estimated body mass index is 34.97 kg/m as calculated from the following:   Height as of this encounter: 5\' 8"  (1.727 m).   Weight as of this encounter: 230 lb (104.3 kg).  Risk Assessment: Allergies: Reviewed. She is allergic  to bactrim [sulfamethoxazole-trimethoprim], levofloxacin, fenofibrate, and tramadol.  Allergy Precautions: None required Coagulopathies: Reviewed. None identified.  Blood-thinner therapy: None at this time Active Infection(s): Reviewed. None identified. Chelsea Morrow is afebrile  Site Confirmation: Chelsea Morrow was asked to confirm the procedure and laterality before marking the site, which she did. Procedure checklist: Completed Consent: Before the procedure and under the influence of no sedative(s), amnesic(s), or anxiolytics, the patient was informed of the treatment options, risks and possible complications. To fulfill our ethical and legal obligations, as recommended by the American Medical Association's Code of Ethics, I have informed the patient of my clinical impression; the nature and purpose of the treatment or procedure; the risks, benefits, and possible complications of the intervention; the alternatives, including doing nothing; the risk(s) and benefit(s) of the alternative treatment(s) or procedure(s); and the risk(s) and benefit(s) of doing nothing.  Chelsea Morrow was provided with information about the general risks and possible complications associated with most interventional procedures. These include, but are not limited to: failure to achieve desired goals, infection, bleeding, organ or nerve damage, allergic reactions, paralysis, and/or death.  In addition, she was informed of those risks and possible complications associated to this particular procedure, which include, but are not limited to: damage to the implant; failure to decrease pain; local, systemic, or serious CNS infections, intraspinal abscess with possible cord compression and paralysis, or life-threatening such as meningitis; intrathecal and/or epidural bleeding with formation of hematoma with possible spinal cord compression and permanent paralysis; organ damage; nerve injury or damage with subsequent sensory, motor, and/or  autonomic system dysfunction, resulting in transient or permanent pain, numbness, and/or weakness of one or several areas of the body; allergic reactions, either minor or major  life-threatening, such as anaphylactic or anaphylactoid reactions.  Furthermore, Chelsea Morrow was informed of those risks and complications associated with the medications. These include, but are not limited to: allergic reactions (i.e.: anaphylactic or anaphylactoid reactions); arrhythmia;  Hypotension/hypertension; cardiovascular collapse; respiratory depression and/or shortness of breath; swelling or edema; medication-induced neural toxicity; particulate matter embolism and blood vessel occlusion with resultant organ, and/or nervous system infarction and permanent paralysis.  Finally, she was informed that Medicine is not an exact science; therefore, there is also the possibility of unforeseen or unpredictable risks and/or possible complications that may result in a catastrophic outcome. The patient indicated having understood very clearly. We have given the patient no guarantees and we have made no promises. Enough time was given to the patient to ask questions, all of which were answered to the patient's satisfaction. Chelsea Morrow has indicated that she wanted to continue with the procedure. Attestation: I, the ordering provider, attest that I have discussed with the patient the benefits, risks, side-effects, alternatives, likelihood of achieving goals, and potential problems during recovery for the procedure that I have provided informed consent. Date  Time: 09/06/2021  9:26 AM  Pre-Procedure Preparation:  Monitoring: As per clinic protocol. Respiration, ETCO2, SpO2, BP, heart rate and rhythm monitor placed and checked for adequate function Safety Precautions: Patient was assessed for positional comfort and pressure points before starting the procedure. Time-out: I initiated and conducted the "Time-out" before starting the  procedure, as per protocol. The patient was asked to participate by confirming the accuracy of the "Time Out" information. Verification of the correct person, site, and procedure were performed and confirmed by me, the nursing staff, and the patient. "Time-out" conducted as per Joint Commission's Universal Protocol (UP.01.01.01). Time: 0956  Description of Procedure Process:   Position: Prone Target Area: <1 cm from targeted nerve (Medial Branch Nerve) Approach: Posterior percutaneous, paramedial, interlaminar approach Area Prepped: Entire Lumbar Region Prepping solution: ChloraPrep (2% chlorhexidine gluconate and 70% isopropyl alcohol) Safety Precautions: Safe injection practices and needle disposal techniques used. Medications properly checked for expiration dates. SDV (single dose vial) medications used. Aspiration looking for blood return and/or CSF was conducted prior to all injections. At no point did I inject any substances, as a needle was being advanced. No attempts were made at seeking any paresthesias.  Description of the Procedure (Lumbar Medial Branch):  Availability of a responsible, adult driver, and NPO status confirmed. Informed consent was obtained after having discussed risks and possible complications. An IV was started. The patient was then taken to the fluoroscopy suite, where the patient was placed in position for the procedure, over the fluoroscopy table. The patient was then monitored in the usual manner. Fluoroscopy was manipulated to obtain the best possible view of the target. Parallex error was corrected before commencing the procedure. Once a clear view of the target had been obtained, the skin and subcutaneous tissue over the procedure site were infiltrated using lidocaine, loaded in a 10 cc luer-loc syringe with a 0.5 inch, 25-G needle. Care was taken to avoid numbing deeper tissues The introducer needle(s) was/were then inserted through the skin and deeper tissues,  specifically the multifidus muscle.  An introducer needle and stimulating probe were  assembled, inserted and advanced along the intended course of the medial branch nerve as it traverses the lamina medial and inferior to the zygapophyseal joint, taking care to maintain the proper depth of insertion as the introducer is advanced under fluoroscopic. The introducer needle was delivered to a location in  proximity to the nerve. Multiple stimulation parameters were used to deliver stimulation to the medial branch nerve in concert with stimulating at multiple positions around the nerve. Nerve target acquisition was confirmed noting generation of paresthesias in the paravertebral regions corresponding to the level being stimulated as well as rhythmic thumping within the multifidi, the latter being further corroborated via palpation.  Various electrical parameter combinations were tested, and the lead location was adjusted (physically relocated) until the patient indicated paresthesia or muscle tension overlapping the distribution of the patient's typical region of pain.  The stimulating probe was removed from the introducer and a percutaneous lead was guided through the needle and delivered to a location in similar proximity to the nerve. Final  location was verified with electrical stimulation and documented with fluoroscopy & ultrasound.  The introducer needle was removed, and the exposed end of the percutaneous lead was attached to an external stimulator unit. Various electrical parameter combinations were again  tested until the patient indicated paresthesia or muscle tension  overlapping the distribution of the patient's typical region of pain.  After confirming that lead impedance was in the normal range, the external unit was detached, the needle was removed, and the lead was anchored at the skin. The lead was threaded into the connector block and electrical continuity and desired patient response was  confirmed. The connector block was attached to the external  stimulator unit.The site was covered with a sterile occlusive dressing and  a fluoroscopic and ultrasound image was taken to document final placement. The patient was observed for stability of vital signs and comfort.     Vitals:   09/06/21 0932 09/06/21 0954 09/06/21 1003 09/06/21 1011  BP: 123/79 132/84 (!) 145/87 134/82  Pulse: 92 82 81 82  Resp: 16 16 16 16   Temp: (!) 97.5 F (36.4 C)     TempSrc: Tympanic     SpO2: 96% 97% 97% 97%  Weight: 230 lb (104.3 kg)     Height: 5\' 8"  (1.727 m)       Start Time: 0956 hrs. End Time:   hrs.  Imaging Guidance (Spinal):          Type of Imaging Technique: Fluoroscopy Guidance (Spinal) and ultrasound guidance to confirm multifidus activation after lead placement. Indication(s): Assistance in needle guidance and placement for procedures requiring needle placement in or near specific anatomical locations not easily accessible without such assistance. Exposure Time: Please see nurses notes. Contrast: None used. Fluoroscopic Guidance: I was personally present during the use of fluoroscopy. "Tunnel Vision Technique" used to obtain the best possible view of the target area. Parallax error corrected before commencing the procedure. "Direction-depth-direction" technique used to introduce the needle under continuous pulsed fluoroscopy. Once target was reached, antero-posterior, oblique, and lateral fluoroscopic projection used confirm needle placement in all planes. Images permanently stored in EMR. Interpretation: No contrast injected. I personally interpreted the imaging intraoperatively. Adequate needle placement confirmed in multiple planes. Permanent images saved into the patient's record.    Antibiotic Prophylaxis:   Anti-infectives (From admission, onward)    None      Indication(s): None identified  Post-operative Assessment:  Post-procedure Vital Signs:  Pulse/HCG Rate: 82  (nsr)  Temp: (!) 97.5 F (36.4 C) Resp: 16 BP: 134/82 SpO2: 97 %  Complications: No immediate post-treatment complications observed by team, or reported by patient.  Note: The patient tolerated the entire procedure well. A repeat set of vitals were taken after the procedure and the patient was kept under  observation following institutional policy, for this type of procedure. Post-procedural neurological assessment was performed, showing return to baseline, prior to discharge. The patient was provided with post-procedure discharge instructions, including a section on how to identify potential problems. Should any problems arise concerning this procedure, the patient was given instructions to immediately contact us, at any time, without hesitation. In any case, we plan to contact the patient by telephone for a follow-up status report regarding this interventional procedure.  Comments:  No additional relevant information.  Plan of Care   5 out of 5 strength bilateral lower extremity: Plantar flexion, dorsiflexion, knee flexion, knee extension.  Patient was accompanied today by her daughter.  I examined her left L4 stimulator lead and the insertion site appears clean, dry intact, no erythema.  She is getting pain relief on her left side.  She hopes to get additional pain relief after her right side lead is inserted today.  Sprint representative present today to do teaching after procedure.  Medications administered: We administered lidocaine and ropivacaine (PF) 2 mg/mL (0.2%).  See the medical record for exact dosing, route, and time of administration.  Follow-up plan:   Return in about 8 weeks (around 11/01/2021) for PNS lead removal in clinic.     Recent Visits Date Type Provider Dept  08/04/21 Procedure visit Edward Jolly, MD [facility]  07/19/21 Office Visit Edward Jolly, MD [facility]  Showing recent visits within past 90 days and meeting all other  requirements Today's Visits Date Type Provider Dept  09/06/21 Procedure visit Edward Jolly, MD [facility]  Showing today's visits and meeting all other requirements Future Appointments Date Type Provider Dept  11/01/21 Appointment Edward Jolly, MD [facility]  Showing future appointments within next 90 days and meeting all other requirements  Disposition: Discharge home  Discharge (Date  Time): 09/06/2021; 1015 hrs.   Primary Care Physician: Marina Goodell, MD Location: [facility] Note by: Edward Jolly, MD Date: 09/06/2021; Time: 10:36 AM

## 2021-09-06 NOTE — Patient Instructions (Signed)

## 2021-09-08 ENCOUNTER — Telehealth: Payer: Self-pay

## 2021-09-08 NOTE — Telephone Encounter (Signed)
Post procedure phone call.  Phone call could not be completed.

## 2021-09-16 DIAGNOSIS — G20C Parkinsonism, unspecified: Secondary | ICD-10-CM | POA: Insufficient documentation

## 2021-09-27 IMAGING — CR DG CHEST 2V
2 series · 3 of 3 positions shown · non-contrast
Comparison: 04/29/2017

CLINICAL DATA: Cough short of breath and wheezing

EXAM:
CHEST - 2 VIEW

[chest pa]
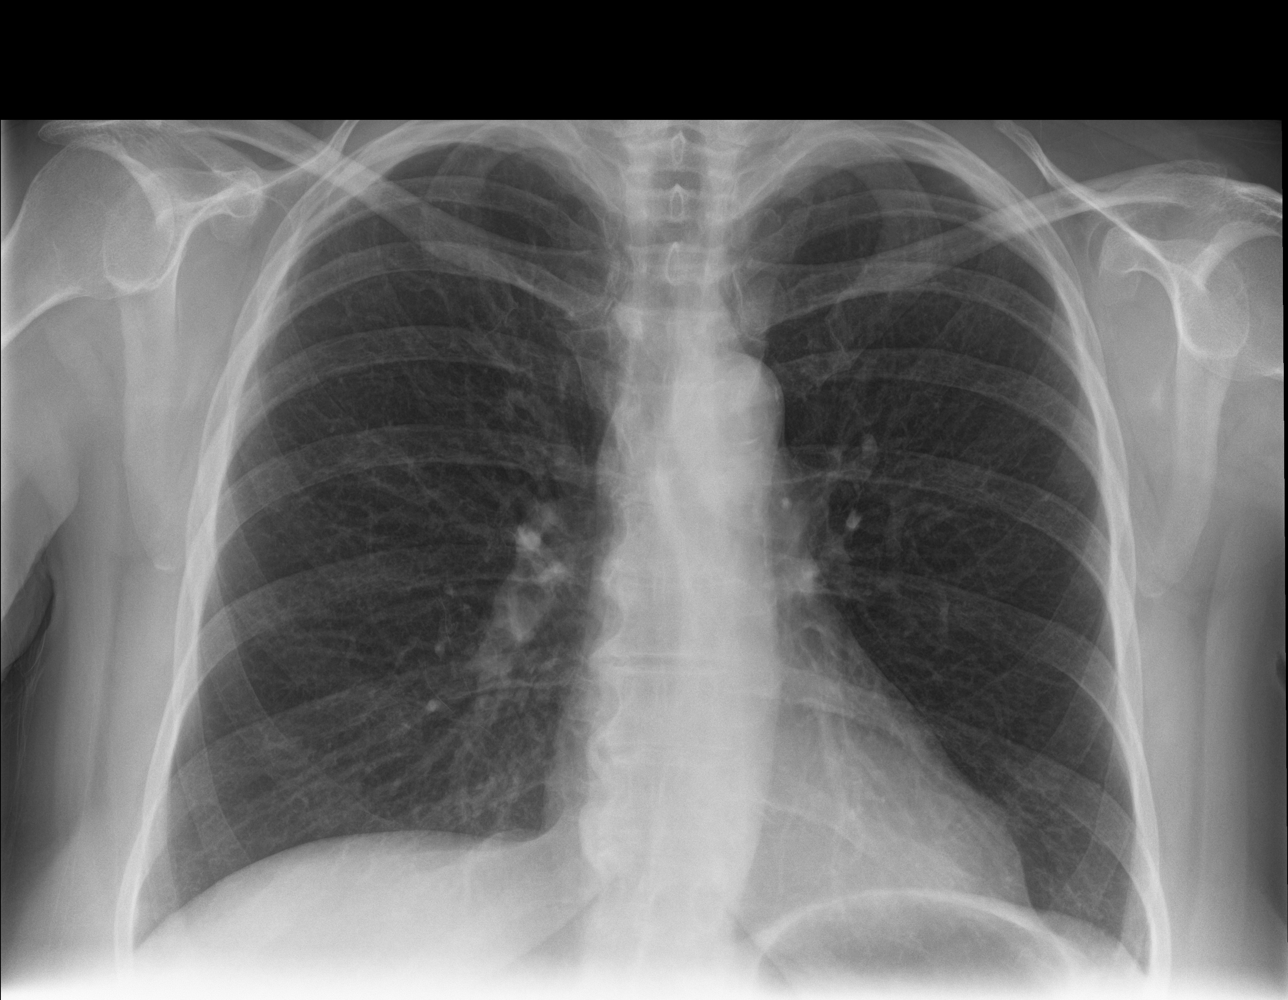

[Series 2: chest lat · 0.14mm/px · 2 of 2 slices shown]
[im 1/2]
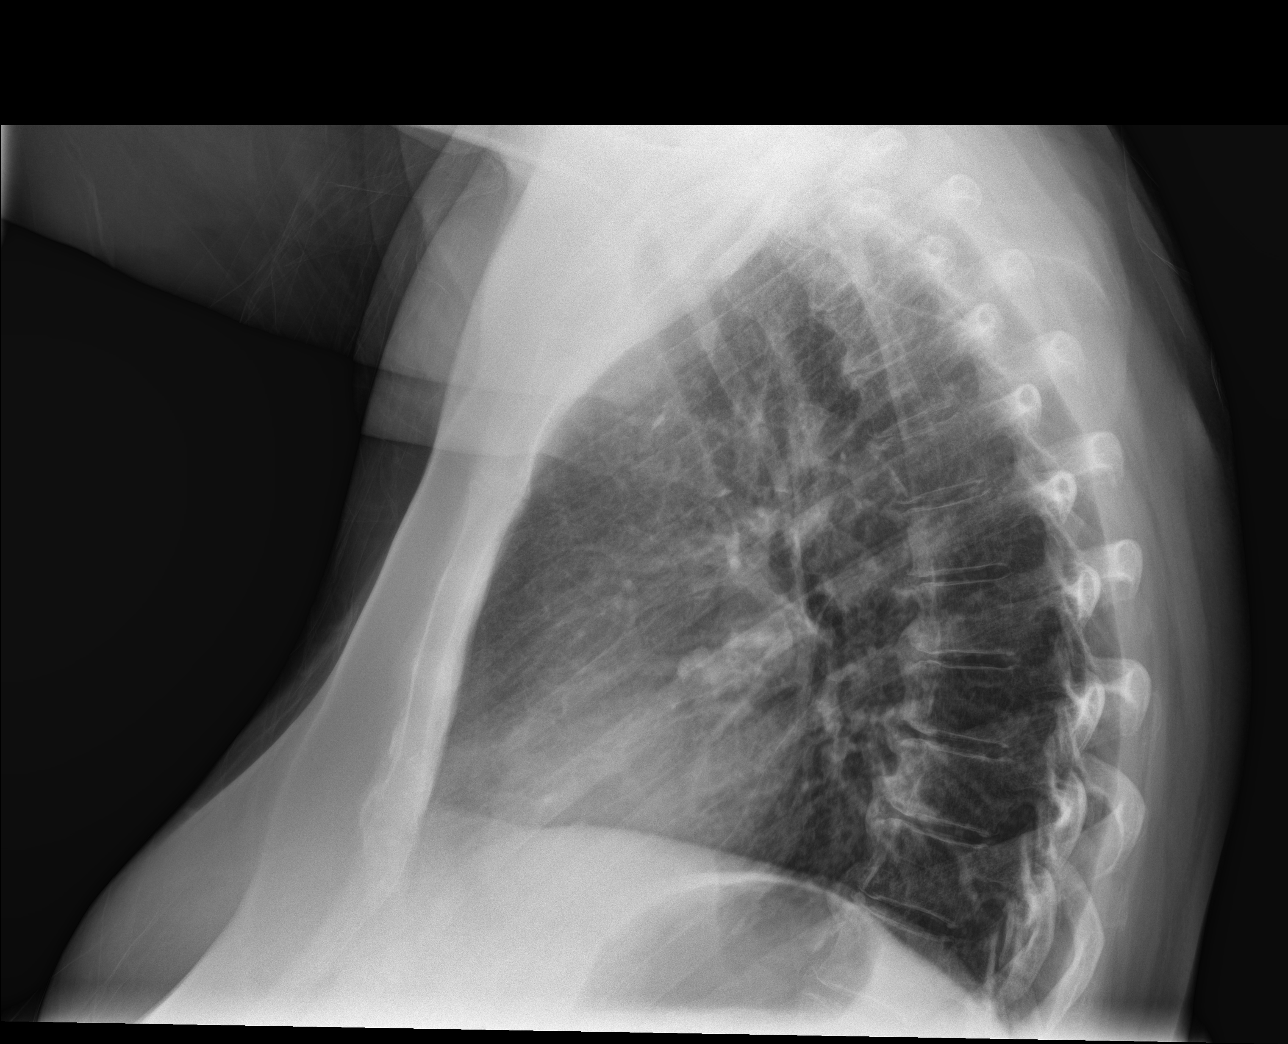
[im 2/2]
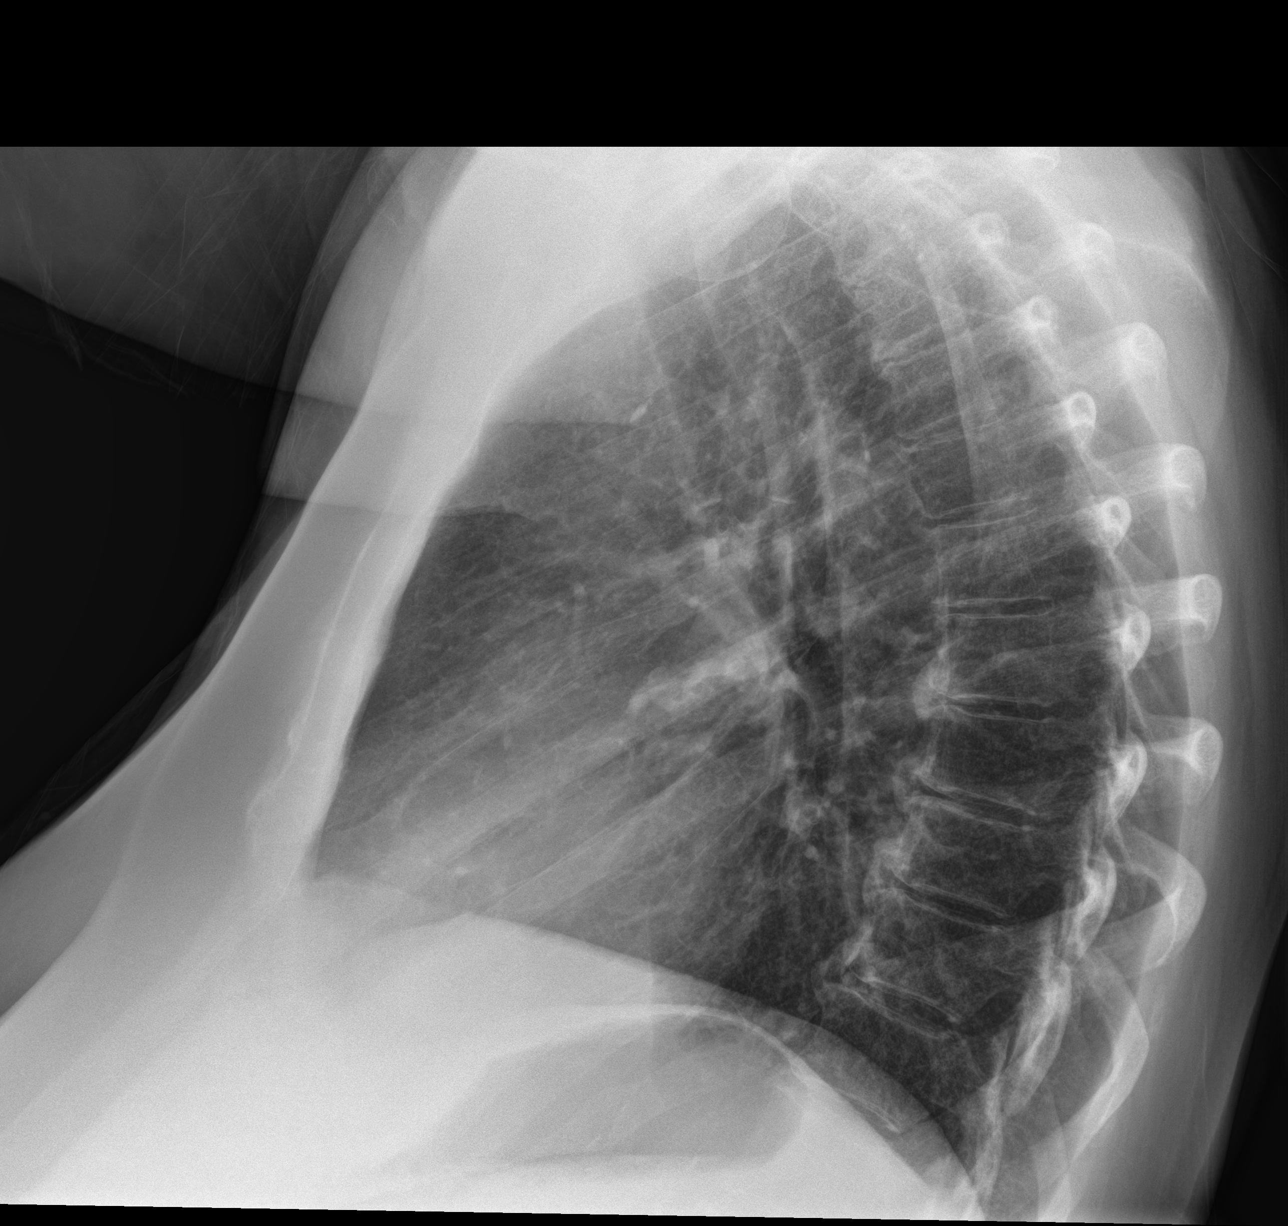

[3 of 3 positions shown; findings below may reference images not displayed]

FINDINGS: The heart size and mediastinal contours are within normal limits.
Both lungs are clear. Degenerative changes.
IMPRESSION: No active cardiopulmonary disease.

## 2021-11-01 ENCOUNTER — Encounter: Payer: Self-pay | Admitting: Student in an Organized Health Care Education/Training Program

## 2021-11-01 ENCOUNTER — Other Ambulatory Visit: Payer: Self-pay

## 2021-11-01 ENCOUNTER — Ambulatory Visit
Payer: Medicare Other | Attending: Student in an Organized Health Care Education/Training Program | Admitting: Student in an Organized Health Care Education/Training Program

## 2021-11-01 VITALS — BP 108/76 | HR 111 | Temp 97.2°F | Resp 16 | Ht 68.0 in | Wt 230.0 lb

## 2021-11-01 DIAGNOSIS — G894 Chronic pain syndrome: Secondary | ICD-10-CM | POA: Insufficient documentation

## 2021-11-01 DIAGNOSIS — M47816 Spondylosis without myelopathy or radiculopathy, lumbar region: Secondary | ICD-10-CM | POA: Insufficient documentation

## 2021-11-01 DIAGNOSIS — M5136 Other intervertebral disc degeneration, lumbar region: Secondary | ICD-10-CM | POA: Insufficient documentation

## 2021-11-01 NOTE — Progress Notes (Signed)
Safety precautions to be maintained throughout the outpatient stay will include: orient to surroundings, keep bed in low position, maintain call bell within reach at all times, provide assistance with transfer out of bed and ambulation.  

## 2021-11-01 NOTE — Progress Notes (Signed)
PROVIDER NOTE: Information contained herein reflects review and annotations entered in association with encounter. Interpretation of such information and data should be left to medically-trained personnel. Information provided to patient can be located elsewhere in the medical record under "Patient Instructions". Document created using STT-dictation technology, any transcriptional errors that may result from process are unintentional.    Patient: Chelsea Morrow  Service Category: E/M  Provider: Gillis Santa, MD  DOB: 13-Feb-1946  DOS: 11/01/2021  Referring Provider: Sofie Hartigan, MD  MRN: [id number]  Specialty: Interventional Pain Management  PCP: Sofie Hartigan, MD  Type: Established Patient  Setting: Ambulatory outpatient    Location: Office  Delivery: Face-to-face     HPI  Ms. Chelsea Morrow, a 77 y.o. year old female, is here today because of her Lumbar facet arthropathy [M47.816]. Ms. Chelsea Morrow primary complain today is Back Pain (Lumbar bilateral ) Last encounter: My last encounter with her was on 09/06/2021. Pertinent problems: Ms. Chelsea Morrow has Lumbar spondylosis; Spinal stenosis, lumbar region, with neurogenic claudication; Lumbar degenerative disc disease; and Chronic pain syndrome on their pertinent problem list. Pain Assessment: Severity of Chronic pain is reported as a 0-No pain/10. Location: Back Lower, Left, Right/denies. Onset: More than a month ago. Quality: Nagging. Timing: Occasional. Modifying factor(s): PNS. Vitals:  height is '5\' 8"'  (1.727 m) and weight is 230 lb (104.3 kg). Her temporal temperature is 97.2 F (36.2 C) (abnormal). Her blood pressure is 108/76 and her pulse is 111 (abnormal). Her respiration is 16 and oxygen saturation is 96%.   Reason for encounter: post-procedure evaluation and assessment.  Removal of Sprint peripheral nerve stimulator microbleeds.   Post-procedure evaluation  Bilateral Sprint L4 medial branch peripheral nerve stimulation  Effectiveness:   Initial hour after procedure: 100 %  Subsequent 4-6 hours post-procedure: 100 %  Analgesia past initial 6 hours: 100 % (she states that there were days when she had pain.  but mainly pain free.)  Ongoing improvement:  Analgesic: 100% Function: Ms. Chelsea Morrow reports improvement in function ROM: Ms. Chelsea Morrow reports improvement in ROM     ROS  Constitutional: Denies any fever or chills Gastrointestinal: No reported hemesis, hematochezia, vomiting, or acute GI distress Musculoskeletal: Denies any acute onset joint swelling, redness, loss of ROM, or weakness Neurological: No reported episodes of acute onset apraxia, aphasia, dysarthria, agnosia, amnesia, paralysis, loss of coordination, or loss of consciousness  Medication Review  DULoxetine, HYDROcodone-acetaminophen, allopurinol, carbidopa-levodopa, cyanocobalamin, gabapentin, glipiZIDE, lisinopril, loratadine, metFORMIN, multivitamin with minerals, pravastatin, primidone, and topiramate  History Review  Allergy: Ms. Chelsea Morrow is allergic to bactrim [sulfamethoxazole-trimethoprim], levofloxacin, fenofibrate, and tramadol. Drug: Ms. Chelsea Morrow  reports no history of drug use. Alcohol:  reports current alcohol use. Tobacco:  reports that she has never smoked. She has never used smokeless tobacco. Social: Ms. Chelsea Morrow  reports that she has never smoked. She has never used smokeless tobacco. She reports current alcohol use. She reports that she does not use drugs. Medical:  has a past medical history of Diabetes mellitus without complication ([place]), Gout, Hypertension, and Neuropathy. Surgical: Ms. Chelsea Morrow  has a past surgical history that includes Abdominal hysterectomy and Cesarean section. Family: family history is not on file.  Laboratory Chemistry Profile   Renal Lab Results  Component Value Date   BUN 54 (H) 03/19/2019   CREATININE 1.93 (H) 03/19/2019   GFRAA 29 (L) 03/19/2019   GFRNONAA 25 (L) 03/19/2019    Hepatic Lab Results  Component Value  Date   AST 19 03/19/2019   ALT  21 03/19/2019   ALBUMIN 4.1 03/19/2019   ALKPHOS 116 03/19/2019    Electrolytes Lab Results  Component Value Date   NA 129 (L) 03/19/2019   K 4.0 03/19/2019   CL 92 (L) 03/19/2019   CALCIUM 9.8 03/19/2019    Bone No results found for: "VD25OH", "VD125OH2TOT", "EU2353IR4", "ER1540GQ6", "25OHVITD1", "25OHVITD2", "25OHVITD3", "TESTOFREE", "TESTOSTERONE"  Inflammation (CRP: Acute Phase) (ESR: Chronic Phase) No results found for: "CRP", "ESRSEDRATE", "LATICACIDVEN"       Note: Above Lab results reviewed.  Recent Imaging Review  DG PAIN CLINIC C-ARM 1-60 MIN NO REPORT Fluoro was used, but no Radiologist interpretation will be provided.  Please refer to "NOTES" tab for provider progress note. Note: Reviewed        Physical Exam  General appearance: Well nourished, well developed, and well hydrated. In no apparent acute distress Mental status: Alert, oriented x 3 (person, place, & time)       Respiratory: No evidence of acute respiratory distress Eyes: PERLA Vitals: BP 108/76 (BP Location: Left Arm, Patient Position: Sitting, Cuff Size: Large)   Pulse (!) 111   Temp (!) 97.2 F (36.2 C) (Temporal)   Resp 16   Ht '5\' 8"'  (1.727 m)   Wt 230 lb (104.3 kg)   SpO2 96%   BMI 34.97 kg/m  BMI: Estimated body mass index is 34.97 kg/m as calculated from the following:   Height as of this encounter: '5\' 8"'  (1.727 m).   Weight as of this encounter: 230 lb (104.3 kg). Ideal: Ideal body weight: 63.9 kg (140 lb 14 oz) Adjusted ideal body weight: 80.1 kg (176 lb 8.4 oz)  Improved low back pain and lumbar range of motion particularly extension flexion  Assessment   Diagnosis Status  1. Lumbar facet arthropathy   2. Lumbar spondylosis   3. Lumbar degenerative disc disease   4. Chronic pain syndrome    Controlled Controlled Controlled   Updated Problems: Problem  Lumbar Spondylosis  Spinal Stenosis, Lumbar Region, With Neurogenic Claudication  Lumbar  Degenerative Disc Disease  Chronic Pain Syndrome     Plan of Care  Patient presents today for removal of her Sprint peripheral nerve stimulator microleads Leads removed with tip intact. Patient is very pleased with the pain results that she has experienced.  She notes 100% pain relief in regards to her low back pain and also finds it easier to perform ADLs.  We will continue to monitor her progress.  Follow-up in 3 to 4 months to see how she is doing after micro lead removal.  Follow-up plan:   Return in about 3 months (around 01/31/2022), or virtual for eval of pain (post PNS removal).    Recent Visits Date Type Provider Dept  09/06/21 Procedure visit Gillis Santa, MD [facility]  08/04/21 Procedure visit Gillis Santa, MD [facility]  Showing recent visits within past 90 days and meeting all other requirements Today's Visits Date Type Provider Dept  11/01/21 Procedure visit Gillis Santa, MD [facility]  Showing today's visits and meeting all other requirements Future Appointments No visits were found meeting these conditions. Showing future appointments within next 90 days and meeting all other requirements  I discussed the assessment and treatment plan with the patient. The patient was provided an opportunity to ask questions and all were answered. The patient agreed with the plan and demonstrated an understanding of the instructions.  Patient advised to call back or seek an in-person evaluation if the symptoms or condition  worsens.  Duration of encounter: 47mnutes.  Total time on encounter, as per AMA guidelines included both the face-to-face and non-face-to-face time personally spent by the physician and/or other qualified health care professional(s) on the day of the encounter (includes time in activities that require the physician or other qualified health care professional and does not include time in activities normally performed by clinical  staff). Physician's time may include the following activities when performed: preparing to see the patient (eg, review of tests, pre-charting review of records) obtaining and/or reviewing separately obtained history performing a medically appropriate examination and/or evaluation counseling and educating the patient/family/caregiver ordering medications, tests, or procedures referring and communicating with other health care professionals (when not separately reported) documenting clinical information in the electronic or other health record independently interpreting results (not separately reported) and communicating results to the patient/ family/caregiver care coordination (not separately reported)  Note by: BGillis Santa MD Date: 11/01/2021; Time: 11:57 AM

## 2021-11-01 NOTE — Progress Notes (Signed)
1109 Removal of PNS leads per Dr. Holley Raring. Leads intact. Sites clear.

## 2021-11-02 ENCOUNTER — Telehealth: Payer: Self-pay

## 2021-11-02 NOTE — Telephone Encounter (Signed)
Post procedure phone call.  Call can not be completed.  kt

## 2022-01-27 ENCOUNTER — Telehealth: Payer: Self-pay | Admitting: *Deleted

## 2022-01-27 NOTE — Telephone Encounter (Signed)
Attempted to call for pre appointment review of allergies/meds. Message left. 

## 2022-01-31 ENCOUNTER — Ambulatory Visit
Payer: Medicare Other | Attending: Student in an Organized Health Care Education/Training Program | Admitting: Student in an Organized Health Care Education/Training Program

## 2022-01-31 ENCOUNTER — Telehealth: Payer: Self-pay | Admitting: *Deleted

## 2022-01-31 DIAGNOSIS — M47816 Spondylosis without myelopathy or radiculopathy, lumbar region: Secondary | ICD-10-CM

## 2022-01-31 NOTE — Telephone Encounter (Signed)
Attempted to call for pre appointment review of allergies/meds. Rang several times, no voicemail, unable to leave a message.

## 2022-01-31 NOTE — Progress Notes (Signed)
I attempted to call the patient however no response.  -Dr Tiare Rohlman  

## 2022-03-28 ENCOUNTER — Ambulatory Visit (INDEPENDENT_AMBULATORY_CARE_PROVIDER_SITE_OTHER): Payer: Medicare Other | Admitting: Internal Medicine

## 2022-03-28 VITALS — BP 103/60 | HR 87 | Resp 16 | Ht 68.0 in | Wt 211.0 lb

## 2022-03-28 DIAGNOSIS — E669 Obesity, unspecified: Secondary | ICD-10-CM | POA: Diagnosis not present

## 2022-03-28 DIAGNOSIS — I10 Essential (primary) hypertension: Secondary | ICD-10-CM

## 2022-03-28 DIAGNOSIS — G4733 Obstructive sleep apnea (adult) (pediatric): Secondary | ICD-10-CM

## 2022-03-28 DIAGNOSIS — Z7189 Other specified counseling: Secondary | ICD-10-CM

## 2022-03-28 NOTE — Progress Notes (Signed)
[facility] [place], [place] [address]  Pulmonary Sleep Medicine   Office Visit Note  Patient Name: Chelsea Morrow DOB: 06-16-1945 MRN [id number]    Chief Complaint: Obstructive Sleep Apnea visit  Brief History:  Nikeisha is seen today for an annual follow up on CPAP at 14 cmh20. The patient has a 15 year history of sleep apnea. Patient is using PAP nightly.  The patient feels rested after sleeping with PAP.  The patient reports benefiting from PAP use. Reported sleepiness is  improved and the Epworth Sleepiness Score is 5 out of 24. The patient does not take naps. The patient complains of the following: No complaints with PAP therapy.  The compliance download shows 90% compliance with an average use time of 7 hours 40 minutes. The AHI is 0.9.  The patient does not complain of limb movements disrupting sleep.  ROS  General: (-) fever, (-) chills, (-) night sweat Nose and Sinuses: (-) nasal stuffiness or itchiness, (-) postnasal drip, (-) nosebleeds, (-) sinus trouble. Mouth and Throat: (-) sore throat, (-) hoarseness. Neck: (-) swollen glands, (-) enlarged thyroid, (-) neck pain. Respiratory: - cough, - shortness of breath, - wheezing. Neurologic: + numbness, + tingling. Psychiatric: - anxiety, - depression   Current Medication: Outpatient Encounter Medications as of 03/28/2022  Medication Sig   metFORMIN (GLUCOPHAGE) 500 MG tablet Take by mouth.   allopurinol (ZYLOPRIM) 300 MG tablet Take 300 mg by mouth daily.   carbidopa-levodopa (SINEMET CR) 50-200 MG tablet Take 2 tablets by mouth 2 (two) times daily.   DULoxetine (CYMBALTA) 60 MG capsule Take 60 mg by mouth daily.   gabapentin (NEURONTIN) 300 MG capsule Take 3 capsules by mouth 2 (two) times daily.   glipiZIDE (GLUCOTROL) 5 MG tablet Take 5 mg by mouth daily before breakfast.   HYDROcodone-acetaminophen (NORCO/VICODIN) 5-325 MG tablet Take 1 tablet by mouth every 6 (six) hours as needed.    lisinopril (ZESTRIL) 20 MG tablet Take 20 mg by mouth daily.   loratadine (CLARITIN) 10 MG tablet Take 10 mg by mouth daily.   Multiple Vitamin (MULTIVITAMIN WITH MINERALS) TABS tablet Take 1 tablet by mouth daily.   omega-3 acid ethyl esters (LOVAZA) 1 g capsule Take 2 capsules by mouth 2 (two) times daily.   pravastatin (PRAVACHOL) 20 MG tablet Take 20 mg by mouth daily.   primidone (MYSOLINE) 50 MG tablet Take by mouth 2 (two) times daily.   topiramate (TOPAMAX) 100 MG tablet Take 200 mg by mouth at bedtime.   traZODone (DESYREL) 50 MG tablet Take 50 mg by mouth at bedtime.   vitamin B-12 (CYANOCOBALAMIN) 1000 MCG tablet Take 1,000 mcg by mouth daily.   [DISCONTINUED] carbidopa-levodopa (SINEMET IR) 25-100 MG tablet Take 1 tablet by mouth in the morning and at bedtime.   [DISCONTINUED] glipiZIDE (GLUCOTROL) 5 MG tablet Take 1 tablet (5 mg total) by mouth 2 (two) times daily.   [DISCONTINUED] metFORMIN (GLUCOPHAGE) 500 MG tablet Take 500 mg by mouth 2 (two) times daily.   No facility-administered encounter medications on file as of 03/28/2022.    Surgical History: Past Surgical History:  Procedure Laterality Date   ABDOMINAL HYSTERECTOMY     CESAREAN SECTION      Medical History: Past Medical History:  Diagnosis Date   Diabetes mellitus without complication ([place])    Gout    Hypertension    Neuropathy     Family History: Non contributory to the present illness  Social History: Social History  Socioeconomic History   Marital status: Married    Spouse name: Not on file   Number of children: Not on file   Years of education: Not on file   Highest education level: Not on file  Occupational History   Not on file  Tobacco Use   Smoking status: Never   Smokeless tobacco: Never  Vaping Use   Vaping Use: Never used  Substance and Sexual Activity   Alcohol use: Yes    Comment: occasional   Drug use: No   Sexual activity: Not on file  Other Topics Concern   Not on file   Social History Narrative   Not on file   Social Determinants of Health   Financial Resource Strain: Not on file  Food Insecurity: Not on file  Transportation Needs: Not on file  Physical Activity: Not on file  Stress: Not on file  Social Connections: Not on file  Intimate Partner Violence: Not on file    Vital Signs: Blood pressure 103/60, pulse 87, resp. rate 16, height 5' 8"$  (1.727 m), weight 211 lb (95.7 kg), SpO2 94 %. Body mass index is 32.08 kg/m.    Examination: General Appearance: The patient is well-developed, well-nourished, and in no distress. Neck Circumference: 40 cm Skin: Gross inspection of skin unremarkable. Head: normocephalic, no gross deformities. Eyes: no gross deformities noted. ENT: ears appear grossly normal Neurologic: Alert and oriented. No involuntary movements.  STOP BANG RISK ASSESSMENT S (snore) Have you been told that you snore?     NO   T (tired) Are you often tired, fatigued, or sleepy during the day?   YES  O (obstruction) Do you stop breathing, choke, or gasp during sleep? NO   P (pressure) Do you have or are you being treated for high blood pressure? YES   B (BMI) Is your body index greater than 35 kg/m? NO   A (age) Are you 80 years old or older? YES   N (neck) Do you have a neck circumference greater than 16 inches?   YES   G (gender) Are you a female? NO   TOTAL STOP/BANG "YES" ANSWERS 4       A STOP-Bang score of 2 or less is considered low risk, and a score of 5 or more is high risk for having either moderate or severe OSA. For people who score 3 or 4, doctors may need to perform further assessment to determine how likely they are to have OSA.         EPWORTH SLEEPINESS SCALE:  Scale:  (0)= no chance of dozing; (1)= slight chance of dozing; (2)= moderate chance of dozing; (3)= high chance of dozing  Chance  Situtation    Sitting and reading: 1    Watching TV: 1    Sitting Inactive in public: 0    As a passenger  in car: 0      Lying down to rest: 3    Sitting and talking: 0    Sitting quielty after lunch: 0    In a car, stopped in traffic: 0   TOTAL SCORE:   5 out of 24    SLEEP STUDIES:  PSG (04/18/06)  AHI 19.8, min SPO2 88%   CPAP COMPLIANCE DATA:  Date Range: 03/21/21 - 03/20/22  Average Daily Use: 7 hours 40 minutes  Median Use: 8 hours 10 minutes  Compliance for > 4 Hours: 327 days  AHI: 0.9 respiratory events per hour  Days Used: 353/365  Mask  Leak: 31.2  95th Percentile Pressure: 14 cmh20         LABS: No results found for this or any previous visit (from the past 2160 hour(s)).  Radiology: DG PAIN CLINIC C-ARM 1-60 MIN NO REPORT  Result Date: 09/06/2021 Fluoro was used, but no Radiologist interpretation will be provided. Please refer to "NOTES" tab for provider progress note.   No results found.  No results found.    Assessment and Plan: Patient Active Problem List   Diagnosis Date Noted   Parkinsonism 09/16/2021   Lumbar spondylosis 07/19/2021   Spinal stenosis, lumbar region, with neurogenic claudication 07/19/2021   Lumbar degenerative disc disease 07/19/2021   Chronic pain syndrome 07/19/2021   Chronic, continuous use of opioids 02/20/2020   CPAP use counseling 01/27/2020   OSA on CPAP 01/27/2020   Kidney disease 11/11/2019   Chronic painful diabetic neuropathy ([place]) 05/09/2019   Chronic kidney disease, stage III (moderate) ([place]) 11/02/2018   Type 2 diabetes mellitus with diabetic chronic kidney disease ([place]) 11/02/2018   Nephrolithiasis 11/02/2018   Chronic gouty arthropathy without tophi 06/19/2017   Type 2 diabetes mellitus with diabetic polyneuropathy ([place]) 06/23/2014   Gastroesophageal reflux disease without esophagitis 12/09/2013   Hyperlipidemia 08/07/2013   Anxiety 08/07/2013   Essential tremor 08/31/2012   Hypertension, essential, benign 09/15/2011   Obesity (BMI 30-39.9) 09/15/2011   Small fiber neuropathy 09/15/2011    1. OSA on CPAP The patient does tolerate PAP and reports  benefit from PAP use. The patient was reminded how to clean equipment and advised to replace supplies routinely. The patient was also counselled on weight loss. The compliance is very good. The AHI is 0.9.   OSA on cpap- controlled. Continue with very good compliance with pap. CPAP continues to be medically necessary to treat this patient's OSA. F/u one year.     2. CPAP use counseling CPAP Counseling: had a lengthy discussion with the patient regarding the importance of PAP therapy in management of the sleep apnea. Patient appears to understand the risk factor reduction and also understands the risks associated with untreated sleep apnea. Patient will try to make a good faith effort to remain compliant with therapy. Also instructed the patient on proper cleaning of the device including the water must be changed daily if possible and use of distilled water is preferred. Patient understands that the machine should be regularly cleaned with appropriate recommended cleaning solutions that do not damage the PAP machine for example given white vinegar and water rinses. Other methods such as ozone treatment may not be as good as these simple methods to achieve cleaning.   3. Hypertension, essential, benign Hypertension Counseling:   The following hypertensive lifestyle modification were recommended and discussed:  1. Limiting alcohol intake to less than 1 oz/day of ethanol:(24 oz of beer or 8 oz of wine or 2 oz of 100-proof whiskey). 2. Take baby ASA 81 mg daily. 3. Importance of regular aerobic exercise and losing weight. 4. Reduce dietary saturated fat and cholesterol intake for overall cardiovascular health. 5. Maintaining adequate dietary potassium, calcium, and magnesium intake. 6. Regular monitoring of the blood pressure. 7. Reduce sodium intake to less than 100 mmol/day (less than 2.3 gm of sodium or less than 6 gm of sodium choride)     4. Obesity (BMI 30-39.9) Obesity Counseling: Had a lengthy discussion regarding patients BMI and weight issues. Patient was instructed on portion control as well as increased activity. Also discussed caloric restrictions with trying to maintain  intake less than 2000 Kcal. Discussions were made in accordance with the 5As of weight management. Simple actions such as not eating late and if able to, taking a walk is suggested.     General Counseling: I have discussed the findings of the evaluation and examination with Malaiah.  I have also discussed any further diagnostic evaluation thatmay be needed or ordered today. Moriya verbalizes understanding of the findings of todays visit. We also reviewed her medications today and discussed drug interactions and side effects including but not limited excessive drowsiness and altered mental states. We also discussed that there is always a risk not just to her but also people around her. she has been encouraged to call the office with any questions or concerns that should arise related to todays visit.  No orders of the defined types were placed in this encounter.       I have personally obtained a history, examined the patient, evaluated laboratory and imaging results, formulated the assessment and plan and placed orders. This patient was seen today by Tressie Ellis, PA-C in collaboration with Dr. Devona Konig.   Allyne Gee, MD [facility] Diplomate ABMS Pulmonary Critical Care Medicine and Sleep Medicine

## 2022-03-28 NOTE — Patient Instructions (Signed)

## 2022-09-14 ENCOUNTER — Ambulatory Visit
Admission: EM | Admit: 2022-09-14 | Discharge: 2022-09-14 | Disposition: A | Discharge status: Home / Self Care | Payer: Medicare Other | Attending: Emergency Medicine | Admitting: Emergency Medicine

## 2022-09-14 DIAGNOSIS — S50312A Abrasion of left elbow, initial encounter: Secondary | ICD-10-CM

## 2022-09-14 DIAGNOSIS — S91112A Laceration without foreign body of left great toe without damage to nail, initial encounter: Secondary | ICD-10-CM

## 2022-09-14 DIAGNOSIS — Z23 Encounter for immunization: Secondary | ICD-10-CM | POA: Diagnosis not present

## 2022-09-14 DIAGNOSIS — W19XXXA Unspecified fall, initial encounter: Secondary | ICD-10-CM | POA: Diagnosis not present

## 2022-09-14 MED ORDER — TETANUS-DIPHTH-ACELL PERTUSSIS 5-2.5-18.5 LF-MCG/0.5 IM SUSY
0.5000 mL | PREFILLED_SYRINGE | Freq: Once | INTRAMUSCULAR | Status: AC
  Administered 2022-09-14: 0.5 mL via INTRAMUSCULAR

## 2022-09-14 MED ORDER — CEPHALEXIN 500 MG PO CAPS: 500.0000 mg | ORAL_CAPSULE | Freq: Three times a day (TID) | ORAL | 0 refills | Status: AC

## 2022-09-14 NOTE — ED Triage Notes (Signed)
fell laceration big toe left ,left hip pain, left elbow 1 day.   Scrape on elbow. Bruise on hip. Cut on toe. Left sided.

## 2022-09-14 NOTE — Discharge Instructions (Addendum)
Keep your abrasions and toe laceration clean and dry.  Soak your left foot in warm water and Epsom salts twice daily.  Apply a new dressing after each soaking to your left toe with a nonadherent dressing, antibiotic ointment, and secured with Coban.  Take the Keflex 3 times a day with food for 7 days to prevent infection.  Keep a dressing on your left toe until a scab has formed over the laceration and abrasion.  Once a scab has formed you can leave the wound open to air when you are at home and cover it with a Band-Aid when you go out in public or when you are wearing shoes.  If you develop any increased redness, swelling, pus drainage, red streaks going up your foot, or fever you need to go to the ER for evaluation.

## 2022-09-14 NOTE — ED Provider Notes (Signed)
[facility]    CSN: [id number] Arrival date & time: 09/14/22  1554      History   Chief Complaint Chief Complaint  Patient presents with   Fall    HPI Chelsea Morrow is a 77 y.o. female.   HPI  77 year old female with a past medical history significant for parkinsonism, neuropathy, hypertension, gout, and diabetes presents for evaluation of injury sustained as a result of a fall.  She is reporting that she has a cut on her left big toe, bruise on her left hip, and a scrape on her left elbow.  Past Medical History:  Diagnosis Date   Diabetes mellitus without complication ([facility])    Gout    Hypertension    Neuropathy     Patient Active Problem List   Diagnosis Date Noted   Parkinsonism 09/16/2021   Lumbar spondylosis 07/19/2021   Spinal stenosis, lumbar region, with neurogenic claudication 07/19/2021   Lumbar degenerative disc disease 07/19/2021   Chronic pain syndrome 07/19/2021   Chronic, continuous use of opioids 02/20/2020   CPAP use counseling 01/27/2020   OSA on CPAP 01/27/2020   Kidney disease 11/11/2019   Chronic painful diabetic neuropathy ([facility]) 05/09/2019   Chronic kidney disease, stage III (moderate) ([facility]) 11/02/2018   Type 2 diabetes mellitus with diabetic chronic kidney disease ([facility]) 11/02/2018   Nephrolithiasis 11/02/2018   Chronic gouty arthropathy without tophi 06/19/2017   Type 2 diabetes mellitus with diabetic polyneuropathy ([facility]) 06/23/2014   Gastroesophageal reflux disease without esophagitis 12/09/2013   Hyperlipidemia 08/07/2013   Anxiety 08/07/2013   Essential tremor 08/31/2012   Hypertension, essential, benign 09/15/2011   Obesity (BMI 30-39.9) 09/15/2011   Small fiber neuropathy 09/15/2011    Past Surgical History:  Procedure Laterality Date   ABDOMINAL HYSTERECTOMY     CESAREAN SECTION      OB History   No obstetric history on file.      Home Medications    Prior to Admission medications   Medication Sig Start  Date End Date Taking? Authorizing Provider  allopurinol (ZYLOPRIM) 300 MG tablet Take 300 mg by mouth daily.   Yes [provider]  carbidopa-levodopa (SINEMET CR) 50-200 MG tablet Take 2 tablets by mouth 2 (two) times daily.   Yes [provider]  cephALEXin (KEFLEX) 500 MG capsule Take 1 capsule (500 mg total) by mouth 3 (three) times daily for 7 days. 09/14/22 09/21/22 Yes Becky Augusta, NP  DULoxetine (CYMBALTA) 60 MG capsule Take 60 mg by mouth daily.   Yes [provider]  gabapentin (NEURONTIN) 300 MG capsule Take 3 capsules by mouth 2 (two) times daily. 12/07/20  Yes [provider]  glipiZIDE (GLUCOTROL) 5 MG tablet Take 5 mg by mouth daily before breakfast. 06/13/19  Yes [provider]  HYDROcodone-acetaminophen (NORCO/VICODIN) 5-325 MG tablet Take 1 tablet by mouth every 6 (six) hours as needed. 11/16/20  Yes [provider]  lisinopril (ZESTRIL) 20 MG tablet Take 20 mg by mouth daily. 02/22/21  Yes [provider]  loratadine (CLARITIN) 10 MG tablet Take 10 mg by mouth daily. 02/15/11  Yes [provider]  metFORMIN (GLUCOPHAGE) 500 MG tablet Take by mouth. 02/23/22 02/23/23 Yes [provider]  Multiple Vitamin (MULTIVITAMIN WITH MINERALS) TABS tablet Take 1 tablet by mouth daily.   Yes [provider]  omega-3 acid ethyl esters (LOVAZA) 1 g capsule Take 2 capsules by mouth 2 (two) times daily.   Yes [provider]  pravastatin (PRAVACHOL) 20  MG tablet Take 20 mg by mouth daily.   Yes [provider]  primidone (MYSOLINE) 50 MG tablet Take by mouth 2 (two) times daily. 02/09/21  Yes [provider]  topiramate (TOPAMAX) 100 MG tablet Take 200 mg by mouth at bedtime. 02/28/21  Yes [provider]  traZODone (DESYREL) 50 MG tablet Take 50 mg by mouth at bedtime.   Yes [provider]  vitamin B-12 (CYANOCOBALAMIN) 1000 MCG tablet Take 1,000 mcg by mouth daily.   Yes  [provider]    Family History History reviewed. No pertinent family history.  Social History Social History   Tobacco Use   Smoking status: Never   Smokeless tobacco: Never  Vaping Use   Vaping status: Never Used  Substance Use Topics   Alcohol use: Yes    Comment: occasional   Drug use: No     Allergies   Bactrim [sulfamethoxazole-trimethoprim], Levofloxacin, Fenofibrate, and Tramadol   Review of Systems Review of Systems  Constitutional:  Negative for fever.  Skin:  Positive for color change and wound.     Physical Exam Triage Vital Signs ED Triage Vitals  Encounter Vitals Group     BP      Systolic BP Percentile      Diastolic BP Percentile      Pulse      Resp      Temp      Temp src      SpO2      Weight      Height      Head Circumference      Peak Flow      Pain Score      Pain Loc      Pain Education      Exclude from Growth Chart    No data found.  Updated Vital Signs BP 123/71 (BP Location: Left Arm)   Pulse 88   Temp 98.2 F (36.8 C)   Resp 16   Wt 197 lb (89.4 kg)   SpO2 93%   BMI 29.95 kg/m   Visual Acuity Right Eye Distance:   Left Eye Distance:   Bilateral Distance:    Right Eye Near:   Left Eye Near:    Bilateral Near:     Physical Exam Vitals and nursing note reviewed.  Constitutional:      Appearance: Normal appearance. She is not ill-appearing.  HENT:     Head: Normocephalic and atraumatic.  Skin:    General: Skin is warm and dry.     Capillary Refill: Capillary refill takes less than 2 seconds.     Findings: Erythema present.  Neurological:     General: No focal deficit present.     Mental Status: She is alert and oriented to person, place, and time.      UC Treatments / Results  Labs (all labs ordered are listed, but only abnormal results are displayed) Labs Reviewed - No data to display  EKG   Radiology No results found.  Procedures Procedures (including critical care  time)  Medications Ordered in UC Medications  Tdap (BOOSTRIX) injection 0.5 mL (has no administration in time range)    Initial Impression / Assessment and Plan / UC Course  I have reviewed the triage vital signs and the nursing notes.  Pertinent labs & imaging results that were available during my care of the patient were reviewed by me and considered in my medical decision making (see chart for details).  Patient is a very pleasant, nontoxic-appearing 77 year old female who presents after suffering a ground-level fall yesterday.  She reports that she was pulling her trash can around the back of her house and she tripped over one of the pavers outlining her flower bed and sustained a laceration to the bottom of her left great toe.  She did land on her left elbow and left hip and she has a small abrasion on the left elbow.    She is able to ambulate and she is moving all extremities equally.  She pulled her left leg up and crossed over her right to examine the bottom of her own foot.  I do not feel that is necessary to perform any x-rays given the patient's free range of motion.  Her primary concern for coming in is because she is a diabetic and she is concerned about developing an infection due to the laceration on her foot.  The laceration happened 18 hours ago and too long of a time has elapsed in order to suture the laceration closed.  There is no active bleeding.  I will have staff perform wound care and cleaning the wound then dressed it with a nonadherent dressing, bacitracin, and Coban.  I have advised her that she needs to soak her foot in warm water and Epsom salts twice daily to help dry out any infection that may be developing.  I will discharge her home on Keflex 500 mg 3 times a day for 7 days to prevent infection.  She is also unsure when her last tetanus shot was so we will update that today.  I have given her infection control and ER precautions.   Final Clinical Impressions(s) /  UC Diagnoses   Final diagnoses:  Fall, initial encounter  Abrasion of left elbow, initial encounter  Laceration of left great toe w/o foreign body w/o damage to nail, initial encounter     Discharge Instructions      Keep your abrasions and toe laceration clean and dry.  Soak your left foot in warm water and Epsom salts twice daily.  Apply a new dressing after each soaking to your left toe with a nonadherent dressing, antibiotic ointment, and secured with Coban.  Take the Keflex 3 times a day with food for 7 days to prevent infection.  Keep a dressing on your left toe until a scab has formed over the laceration and abrasion.  Once a scab has formed you can leave the wound open to air when you are at home and cover it with a Band-Aid when you go out in public or when you are wearing shoes.  If you develop any increased redness, swelling, pus drainage, red streaks going up your foot, or fever you need to go to the ER for evaluation.     ED Prescriptions     Medication Sig Dispense Auth. Provider   cephALEXin (KEFLEX) 500 MG capsule Take 1 capsule (500 mg total) by mouth 3 (three) times daily for 7 days. 21 capsule Becky Augusta, NP      PDMP not reviewed this encounter.   Becky Augusta, NP 09/14/22 [phone]

## 2023-04-02 NOTE — Progress Notes (Signed)
 [facility] [address] [place], [place] [address]  Pulmonary Sleep Medicine   Office Visit Note  Patient Name: Chelsea Morrow DOB: 08-05-1945 MRN [id number]    Chief Complaint: Obstructive Sleep Apnea visit  Brief History:  Chelsea Morrow is seen today for an annual follow up on CPAP@14cmh20 .  The patient has a 17 year history of sleep apnea. Patient is using PAP nightly.  The patient feels rested after sleeping with PAP.  The patient reports benefit  from PAP use. Reported sleepiness is  improved and the Epworth Sleepiness Score is 5 out of 24. The patient does not usually take naps. The patient complains of the following: dry mouth. Discussed using a chin strap to help with this. She has tried Full Face Mask and does not tolerate.  The compliance download shows 83% compliance with an average use time of 7 hours 23 min The AHI is 0.4  The patient does not complain of limb movements disrupting sleep.  ROS  General: (-) fever, (-) chills, (-) night sweat Nose and Sinuses: (-) nasal stuffiness or itchiness, (-) postnasal drip, (-) nosebleeds, (-) sinus trouble. Mouth and Throat: (-) sore throat, (-) hoarseness. Neck: (-) swollen glands, (-) enlarged thyroid, (-) neck pain. Respiratory: - cough, - shortness of breath, - wheezing. Neurologic: + numbness, + tingling due to neuropathy Psychiatric: + anxiety, - depression   Current Medication: Outpatient Encounter Medications as of 04/03/2023  Medication Sig   lisinopril (ZESTRIL) 10 MG tablet Take 1 tablet by mouth daily.   allopurinol (ZYLOPRIM) 300 MG tablet Take 300 mg by mouth daily.   carbidopa-levodopa (SINEMET CR) 50-200 MG tablet Take 2 tablets by mouth 2 (two) times daily.   DULoxetine (CYMBALTA) 60 MG capsule Take 60 mg by mouth daily.   gabapentin (NEURONTIN) 300 MG capsule Take 3 capsules by mouth 2 (two) times daily.   glipiZIDE (GLUCOTROL) 5 MG tablet Take 5 mg by mouth daily before breakfast.   loratadine  (CLARITIN) 10 MG tablet Take 10 mg by mouth daily.   metFORMIN (GLUCOPHAGE) 500 MG tablet Take by mouth.   Multiple Vitamin (MULTIVITAMIN WITH MINERALS) TABS tablet Take 1 tablet by mouth daily.   omega-3 acid ethyl esters (LOVAZA) 1 g capsule Take 2 capsules by mouth 2 (two) times daily.   pravastatin (PRAVACHOL) 20 MG tablet Take 20 mg by mouth daily.   primidone (MYSOLINE) 50 MG tablet Take by mouth 2 (two) times daily.   topiramate (TOPAMAX) 100 MG tablet Take 200 mg by mouth at bedtime.   traZODone (DESYREL) 50 MG tablet Take 50 mg by mouth at bedtime.   vitamin B-12 (CYANOCOBALAMIN) 1000 MCG tablet Take 1,000 mcg by mouth daily.   [DISCONTINUED] HYDROcodone-acetaminophen (NORCO/VICODIN) 5-325 MG tablet Take 1 tablet by mouth every 6 (six) hours as needed.   [DISCONTINUED] lisinopril (ZESTRIL) 20 MG tablet Take 20 mg by mouth daily.   No facility-administered encounter medications on file as of 04/03/2023.    Surgical History: Past Surgical History:  Procedure Laterality Date   ABDOMINAL HYSTERECTOMY     CESAREAN SECTION      Medical History: Past Medical History:  Diagnosis Date   Diabetes mellitus without complication (HCC)    Gout    Hypertension    Neuropathy     Family History: Non contributory to the present illness  Social History: Social History   Socioeconomic History   Marital status: Married    Spouse name: Not on file   Number of children: Not  on file   Years of education: Not on file   Highest education level: Not on file  Occupational History   Not on file  Tobacco Use   Smoking status: Never   Smokeless tobacco: Never  Vaping Use   Vaping status: Never Used  Substance and Sexual Activity   Alcohol use: Yes    Comment: occasional   Drug use: No   Sexual activity: Not on file  Other Topics Concern   Not on file  Social History Narrative   Not on file   Social Drivers of Health   Financial Resource Strain: Low Risk  (08/10/2022)   Received  from [facility]   Overall Financial Resource Strain (CARDIA)    Difficulty of Paying Living Expenses: Not hard at all  Food Insecurity: No Food Insecurity (08/10/2022)   Received from [facility]   Hunger Vital Sign    Worried About Running Out of Food in the Last Year: Never true    Ran Out of Food in the Last Year: Never true  Transportation Needs: No Transportation Needs (08/10/2022)   [facility]    In the past 12 months, has lack of transportation kept you from medical appointments or from getting medications?: No    Lack of Transportation (Non-Medical): No  Physical Activity: Inactive (03/25/2019)   Received from [facility], [facility]   Exercise Vital Sign    Days of Exercise per Week: 0 days    Minutes of Exercise per Session: 0 min  Stress: No Stress Concern Present (03/25/2019)   Received from [facility], [facility]   [employer] of Occupational Health - Occupational Stress Questionnaire    Feeling of Stress : Not at all  Social Connections: Moderately Integrated (03/25/2019)   Received from [facility], [facility]   Social Connection and Isolation Panel [NHANES]    Frequency of Communication with Friends and Family: More than three times a week    Frequency of Social Gatherings with Friends and Family: Once a week    Attends Religious Services: More than 4 times per year    Active Member of [employer] or Organizations: No    Attends Banker Meetings: Never    Marital Status: Married  Catering manager Violence: Not on file    Vital Signs: Blood pressure 98/68, pulse 83, resp. rate 12, height 5' 7.5" (1.715 m), weight 187 lb (84.8 kg), SpO2 96%. Body mass index is 28.86 kg/m.    Examination: General Appearance: The patient is well-developed,  well-nourished, and in no distress. Neck Circumference: 37.5cm Skin: Gross inspection of skin unremarkable. Head: normocephalic, no gross deformities. Eyes: no gross deformities noted. ENT: ears appear grossly normal Neurologic: Alert and oriented. No involuntary movements.  STOP BANG RISK ASSESSMENT S (snore) Have you been told that you snore?     YES   T (tired) Are you often tired, fatigued, or sleepy during the day?   NO  O (obstruction) Do you stop breathing, choke, or gasp during sleep? NO   P (pressure) Do you have or are you being treated for high blood pressure? YES   B (BMI) Is your body index greater than 35 kg/m? NO   A (age) Are you 74 years old or older? YES   N (neck) Do you have a neck circumference greater than 16 inches?  NO   G (gender) Are you a female? NO   TOTAL STOP/BANG "YES" ANSWERS        A STOP-Bang score of 2 or less is considered low risk, and a score of 5 or more is high risk for having either moderate or severe OSA. For people who score 3 or 4, doctors may need to perform further assessment to determine how likely they are to have OSA.         EPWORTH SLEEPINESS SCALE:  Scale:  (0)= no chance of dozing; (1)= slight chance of dozing; (2)= moderate chance of dozing; (3)= high chance of dozing  Chance  Situtation    Sitting and reading: 1    Watching TV: 1    Sitting Inactive in public: 0    As a passenger in car: 0      Lying down to rest: 3    Sitting and talking: 0    Sitting quielty after lunch: 0    In a car, stopped in traffic: 0   TOTAL SCORE:   5 out of 24    SLEEP STUDIES:  PSG - [facility] - 04/18/2006 - AHI 19.8/hr  Supine AHI 61.4/hr  Sp02 51% Titration - [facility] - 05/08/2006 - CPAP @14cmH20    CPAP COMPLIANCE DATA:  Date Range: 04/02/2022-04/01/2023  Average Daily Use: 7 hours23 min  Median Use: 7 hr 26 min  Compliance for > 4 Hours: 83% days  AHI: 0.4 respiratory events per  hour  Days Used: 352/365  Mask Leak: 40.1  95th Percentile Pressure: 14cmh20         LABS: No results found for this or any previous visit (from the past 2160 hours).  Radiology: No results found.  No results found.  No results found.    Assessment and Plan: Patient Active Problem List   Diagnosis Date Noted   Parkinsonism (HCC) 09/16/2021   Lumbar spondylosis 07/19/2021   Spinal stenosis, lumbar region, with neurogenic claudication 07/19/2021   Lumbar degenerative disc disease 07/19/2021   Chronic pain syndrome 07/19/2021   Chronic, continuous use of opioids 02/20/2020   CPAP use counseling 01/27/2020   OSA on CPAP 01/27/2020   Kidney disease 11/11/2019   Chronic painful diabetic neuropathy (HCC) 05/09/2019   Chronic kidney disease, stage III (moderate) (HCC) 11/02/2018   Type 2 diabetes mellitus with diabetic chronic kidney disease (HCC) 11/02/2018   Nephrolithiasis 11/02/2018   Chronic gouty arthropathy without tophi 06/19/2017   Type 2 diabetes mellitus with diabetic polyneuropathy (HCC) 06/23/2014   Gastroesophageal reflux disease without esophagitis 12/09/2013   Hyperlipidemia 08/07/2013   Anxiety 08/07/2013   Essential tremor 08/31/2012   Hypertension, essential, benign 09/15/2011   Obesity (BMI 30-39.9) 09/15/2011   Small fiber neuropathy 09/15/2011   1. OSA on CPAP (Primary) The patient does tolerate PAP and reports  benefit from PAP use. The patient was reminded how to clean equipment and advised to replace supplies routinely. The patient was also counselled on weight loss. The compliance is good. The AHI is 0.4. Add chin strap for mouth venting.    OSA on cpap- controlled. Continue with good compliance with pap. CPAP continues to be medically necessary to treat this patient's OSA. F/u one year.    2. CPAP use counseling CPAP Counseling: had a lengthy discussion with the patient regarding the importance of PAP therapy in management of the sleep  apnea. Patient appears to understand the risk factor reduction and also understands the risks associated with untreated  sleep apnea. Patient will try to make a good faith effort to remain compliant with therapy. Also instructed the patient on proper cleaning of the device including the water must be changed daily if possible and use of distilled water is preferred. Patient understands that the machine should be regularly cleaned with appropriate recommended cleaning solutions that do not damage the PAP machine for example given white vinegar and water rinses. Other methods such as ozone treatment may not be as good as these simple methods to achieve cleaning.   3. Hypertension, essential, benign Somewhat  overcontrolled today. She will talk to her doctor about adjusting her medication.     General Counseling: I have discussed the findings of the evaluation and examination with Ch[place]y.  I have also discussed any further diagnostic evaluation thatmay be needed or ordered today. Unity verbalizes understanding of the findings of todays visit. We also reviewed her medications today and discussed drug interactions and side effects including but not limited excessive drowsiness and altered mental states. We also discussed that there is always a risk not just to her but also people around her. she has been encouraged to call the office with any questions or concerns that should arise related to todays visit.  No orders of the defined types were placed in this encounter.       I have personally obtained a history, examined the patient, evaluated laboratory and imaging results, formulated the assessment and plan and placed orders. This patient was seen today by Emmaline Kluver, PA-C in collaboration with Dr. Freda Munro.   Yevonne Pax, MD [facility] Diplomate ABMS Pulmonary Critical Care Medicine and Sleep Medicine

## 2023-04-03 ENCOUNTER — Ambulatory Visit (INDEPENDENT_AMBULATORY_CARE_PROVIDER_SITE_OTHER): Payer: Medicare Other | Admitting: Internal Medicine

## 2023-04-03 VITALS — BP 98/68 | HR 83 | Resp 12 | Ht 67.5 in | Wt 187.0 lb

## 2023-04-03 DIAGNOSIS — I10 Essential (primary) hypertension: Secondary | ICD-10-CM | POA: Diagnosis not present

## 2023-04-03 DIAGNOSIS — Z7189 Other specified counseling: Secondary | ICD-10-CM | POA: Diagnosis not present

## 2023-04-03 DIAGNOSIS — G4733 Obstructive sleep apnea (adult) (pediatric): Secondary | ICD-10-CM

## 2023-04-03 NOTE — Patient Instructions (Signed)

## 2023-04-29 ENCOUNTER — Ambulatory Visit
Admission: EM | Admit: 2023-04-29 | Discharge: 2023-04-29 | Disposition: A | Discharge status: Home / Self Care | Attending: Physician Assistant | Admitting: Physician Assistant

## 2023-04-29 ENCOUNTER — Encounter: Payer: Self-pay | Admitting: Emergency Medicine

## 2023-04-29 DIAGNOSIS — N3001 Acute cystitis with hematuria: Secondary | ICD-10-CM | POA: Insufficient documentation

## 2023-04-29 LAB — URINALYSIS, W/ REFLEX TO CULTURE (INFECTION SUSPECTED)
Glucose, UA: 100 mg/dL — AB
Nitrite: POSITIVE — AB
Protein, ur: >300 mg/dL — AB
Specific Gravity, Urine: <1.005 — ABNORMAL LOW (ref 1.005–1.030)
WBC, UA: >50 WBC/hpf (ref 0–5)
pH: 5 (ref 5.0–8.0)

## 2023-04-29 MED ORDER — CEPHALEXIN 500 MG PO CAPS: 500.0000 mg | ORAL_CAPSULE | Freq: Four times a day (QID) | ORAL | 0 refills | Status: AC

## 2023-04-29 NOTE — ED Provider Notes (Signed)
 [facility]    CSN: [id number] Arrival date & time: 04/29/23  1007      History   Chief Complaint Chief Complaint  Patient presents with   Dysuria    HPI Chelsea Morrow is a 78 y.o. female.   Patient presents today with a 24-hour history of UTI symptoms.  She reports hematuria, dysuria, frequency beginning yesterday evening.  Denies any abdominal pain, pelvic pain, vaginal discharge, fever, nausea, vomiting.  She has not tried any over-the-counter medication for symptom management.  She does have a history of UTI as well as nephrolithiasis; reports current symptoms are not similar to previous episodes of kidney stones.  Denies any recent antibiotic use.  Denies any recent urogenital procedure or self-catheterization.  She does not follow with urology currently.  She does have a history of diabetes but reports her blood sugars are well-controlled.  She does not take SGLT2 inhibitor.    Past Medical History:  Diagnosis Date   Diabetes mellitus without complication (HCC)    Gout    Hypertension    Neuropathy     Patient Active Problem List   Diagnosis Date Noted   Parkinsonism (HCC) 09/16/2021   Lumbar spondylosis 07/19/2021   Spinal stenosis, lumbar region, with neurogenic claudication 07/19/2021   Lumbar degenerative disc disease 07/19/2021   Chronic pain syndrome 07/19/2021   Chronic, continuous use of opioids 02/20/2020   CPAP use counseling 01/27/2020   OSA on CPAP 01/27/2020   Kidney disease 11/11/2019   Chronic painful diabetic neuropathy (HCC) 05/09/2019   Chronic kidney disease, stage III (moderate) (HCC) 11/02/2018   Type 2 diabetes mellitus with diabetic chronic kidney disease (HCC) 11/02/2018   Nephrolithiasis 11/02/2018   Chronic gouty arthropathy without tophi 06/19/2017   Type 2 diabetes mellitus with diabetic polyneuropathy (HCC) 06/23/2014   Gastroesophageal reflux disease without esophagitis 12/09/2013   Hyperlipidemia 08/07/2013   Anxiety  08/07/2013   Essential tremor 08/31/2012   Hypertension, essential, benign 09/15/2011   Obesity (BMI 30-39.9) 09/15/2011   Small fiber neuropathy 09/15/2011    Past Surgical History:  Procedure Laterality Date   ABDOMINAL HYSTERECTOMY     CESAREAN SECTION      OB History   No obstetric history on file.      Home Medications    Prior to Admission medications   Medication Sig Start Date End Date Taking? Authorizing Provider  allopurinol (ZYLOPRIM) 300 MG tablet Take 300 mg by mouth daily.   Yes [provider]  carbidopa-levodopa (SINEMET CR) 50-200 MG tablet Take 2 tablets by mouth 2 (two) times daily.   Yes [provider]  cephALEXin (KEFLEX) 500 MG capsule Take 1 capsule (500 mg total) by mouth 4 (four) times daily. 04/29/23  Yes Allon Costlow K, PA-C  DULoxetine (CYMBALTA) 60 MG capsule Take 60 mg by mouth daily.   Yes [provider]  gabapentin (NEURONTIN) 300 MG capsule Take 3 capsules by mouth 2 (two) times daily. 12/07/20  Yes [provider]  lisinopril (ZESTRIL) 20 MG tablet Take 1 tablet by mouth daily. 01/23/23  Yes [provider]  loratadine (CLARITIN) 10 MG tablet Take 10 mg by mouth daily. 02/15/11  Yes [provider]  Multiple Vitamin (MULTIVITAMIN WITH MINERALS) TABS tablet Take 1 tablet by mouth daily.   Yes [provider]  omega-3 acid ethyl esters (LOVAZA) 1 g capsule Take 2 capsules by mouth 2 (two) times daily.   Yes [provider]  pravastatin (PRAVACHOL) 20 MG tablet  Take 20 mg by mouth daily.   Yes [provider]  primidone (MYSOLINE) 50 MG tablet Take by mouth 2 (two) times daily. 02/09/21  Yes [provider]  topiramate (TOPAMAX) 100 MG tablet Take 200 mg by mouth at bedtime. 02/28/21  Yes [provider]  traZODone (DESYREL) 50 MG tablet Take 50 mg by mouth at bedtime.   Yes [provider]  vitamin B-12 (CYANOCOBALAMIN) 1000 MCG tablet Take 1,000  mcg by mouth daily.   Yes [provider]  metFORMIN (GLUCOPHAGE) 500 MG tablet Take by mouth. 02/23/22 02/23/23  [provider]    Family History History reviewed. No pertinent family history.  Social History Social History   Tobacco Use   Smoking status: Never   Smokeless tobacco: Never  Vaping Use   Vaping status: Never Used  Substance Use Topics   Alcohol use: Yes    Comment: occasional   Drug use: No     Allergies   Bactrim [sulfamethoxazole-trimethoprim], Levofloxacin, Fenofibrate, and Tramadol   Review of Systems Review of Systems  Constitutional:  Positive for activity change. Negative for appetite change, fatigue and fever.  Gastrointestinal:  Positive for abdominal pain. Negative for diarrhea, nausea and vomiting.  Genitourinary:  Positive for dysuria, frequency and urgency. Negative for flank pain, pelvic pain, vaginal bleeding, vaginal discharge and vaginal pain.  Musculoskeletal:  Negative for arthralgias, back pain and myalgias.     Physical Exam Triage Vital Signs ED Triage Vitals  Encounter Vitals Group     BP 04/29/23 1133 103/64     Systolic BP Percentile --      Diastolic BP Percentile --      Pulse Rate 04/29/23 1133 88     Resp 04/29/23 1133 16     Temp 04/29/23 1133 98.8 F (37.1 C)     Temp Source 04/29/23 1133 Oral     SpO2 04/29/23 1133 96 %     Weight 04/29/23 1129 186 lb 15.2 oz (84.8 kg)     Height 04/29/23 1129 5' 7.5" (1.715 m)     Head Circumference --      Peak Flow --      Pain Score 04/29/23 1128 8     Pain Loc --      Pain Education --      Exclude from Growth Chart --    No data found.  Updated Vital Signs BP 103/64 (BP Location: Left Arm)   Pulse 88   Temp 98.8 F (37.1 C) (Oral)   Resp 16   Ht 5' 7.5" (1.715 m)   Wt 186 lb 15.2 oz (84.8 kg)   SpO2 96%   BMI 28.85 kg/m   Visual Acuity Right Eye Distance:   Left Eye Distance:   Bilateral Distance:    Right Eye Near:   Left Eye Near:     Bilateral Near:     Physical Exam Vitals reviewed.  Constitutional:      General: She is awake. She is not in acute distress.    Appearance: Normal appearance. She is well-developed. She is not ill-appearing.     Comments: Very pleasant female appears stated age in no acute distress sitting comfortably in exam room  HENT:     Head: Normocephalic and atraumatic.  Cardiovascular:     Rate and Rhythm: Normal rate and regular rhythm.     Heart sounds: Normal heart sounds, S1 normal and S2 normal. No murmur heard. Pulmonary:     Effort: Pulmonary  effort is normal.     Breath sounds: Normal breath sounds. No wheezing, rhonchi or rales.     Comments: Clear to auscultation bilaterally Abdominal:     General: Bowel sounds are normal.     Palpations: Abdomen is soft.     Tenderness: There is no abdominal tenderness. There is no right CVA tenderness, left CVA tenderness, guarding or rebound.     Comments: Benign abdominal exam  Psychiatric:        Behavior: Behavior is cooperative.      UC Treatments / Results  Labs (all labs ordered are listed, but only abnormal results are displayed) Labs Reviewed  URINALYSIS, W/ REFLEX TO CULTURE (INFECTION SUSPECTED) - Abnormal; Notable for the following components:      Result Value   Color, Urine ORANGE (*)    Specific Gravity, Urine <1.005 (*)    Glucose, UA 100 (*)    Hgb urine dipstick MODERATE (*)    Bilirubin Urine SMALL (*)    Ketones, ur TRACE (*)    Protein, ur >300 (*)    Nitrite POSITIVE (*)    Leukocytes,Ua LARGE (*)    Bacteria, UA MANY (*)    All other components within normal limits  URINE CULTURE    EKG   Radiology No results found.  Procedures Procedures (including critical care time)  Medications Ordered in UC Medications - No data to display  Initial Impression / Assessment and Plan / UC Course  I have reviewed the triage vital signs and the nursing notes.  Pertinent labs & imaging results that were  available during my care of the patient were reviewed by me and considered in my medical decision making (see chart for details).     Patient is well-appearing, afebrile, nontoxic, nontachycardic.  UA consistent with UTI.  Will start cephalexin given her medication allergies.  No indication for dose adjustment based on metabolic panel from 01/23/2023 with creatinine of 1.3 and calculated creatinine clearance of 48.27 mL/min.  She was encouraged to push fluids.  Will send for culture and contact her for any to change or discontinue her antibiotics based on culture results.  We discussed that if anything worsens or changes she needs to be seen immediately.  Strict return precautions given.  All questions answered to her satisfaction.  Final Clinical Impressions(s) / UC Diagnoses   Final diagnoses:  Acute cystitis with hematuria     Discharge Instructions      We are treating you for urinary tract infection.  Start cephalexin 4 times daily for 5 days.  Take this with food.  Make sure you rest and drink plenty of fluid.  We will contact you if need to stop or change her antibiotics based on your culture results in a few days.  If you do not hear from [place] please finish the medicine until it is gone.  If anything worsens and you have abdominal pain, fever, nausea/vomiting, weakness, flank pain you need to be seen immediately.     ED Prescriptions     Medication Sig Dispense Auth. Provider   cephALEXin (KEFLEX) 500 MG capsule Take 1 capsule (500 mg total) by mouth 4 (four) times daily. 20 capsule Jyl Ch[place], Noberto Retort, PA-C      PDMP not reviewed this encounter.   Jeani Hawking, PA-C 04/29/23 1206

## 2023-04-29 NOTE — Discharge Instructions (Signed)
 We are treating you for urinary tract infection.  Start cephalexin 4 times daily for 5 days.  Take this with food.  Make sure you rest and drink plenty of fluid.  We will contact you if need to stop or change her antibiotics based on your culture results in a few days.  If you do not hear from [place] please finish the medicine until it is gone.  If anything worsens and you have abdominal pain, fever, nausea/vomiting, weakness, flank pain you need to be seen immediately.

## 2023-04-29 NOTE — ED Triage Notes (Signed)
 Pt c/o dysuria, hematuria. Started yesterday evening.

## 2023-04-30 LAB — URINE CULTURE: Culture: NO GROWTH
